# Patient Record
Sex: Female | Born: 1960 | Race: White | Hispanic: No | Marital: Married | Smoking: Never smoker
Health system: Southern US, Community
[De-identification: ages and names within clinical notes are randomized; demographics above are authoritative.]

## PROBLEM LIST (undated history)

## (undated) DIAGNOSIS — T7840XA Allergy, unspecified, initial encounter: Secondary | ICD-10-CM

## (undated) DIAGNOSIS — F419 Anxiety disorder, unspecified: Secondary | ICD-10-CM

## (undated) DIAGNOSIS — F32A Depression, unspecified: Secondary | ICD-10-CM

## (undated) DIAGNOSIS — F319 Bipolar disorder, unspecified: Secondary | ICD-10-CM

## (undated) DIAGNOSIS — K219 Gastro-esophageal reflux disease without esophagitis: Secondary | ICD-10-CM

## (undated) DIAGNOSIS — R296 Repeated falls: Secondary | ICD-10-CM

## (undated) DIAGNOSIS — W19XXXA Unspecified fall, initial encounter: Secondary | ICD-10-CM

## (undated) DIAGNOSIS — M779 Enthesopathy, unspecified: Secondary | ICD-10-CM

## (undated) HISTORY — DX: Repeated falls: R29.6

## (undated) HISTORY — DX: Unspecified fall, initial encounter: W19.XXXA

## (undated) HISTORY — PX: OVARIAN CYST REMOVAL: SHX89

## (undated) HISTORY — DX: Enthesopathy, unspecified: M77.9

## (undated) HISTORY — DX: Gastro-esophageal reflux disease without esophagitis: K21.9

## (undated) HISTORY — DX: Allergy, unspecified, initial encounter: T78.40XA

## (undated) HISTORY — DX: Anxiety disorder, unspecified: F41.9

## (undated) HISTORY — DX: Bipolar disorder, unspecified: F31.9

## (undated) HISTORY — DX: Depression, unspecified: F32.A

---

## 1998-11-01 HISTORY — PX: HERNIA REPAIR: SHX51

## 2021-06-08 ENCOUNTER — Ambulatory Visit: Payer: Self-pay

## 2021-08-31 ENCOUNTER — Encounter: Payer: Self-pay | Admitting: Adult Health

## 2021-08-31 ENCOUNTER — Ambulatory Visit (INDEPENDENT_AMBULATORY_CARE_PROVIDER_SITE_OTHER): Payer: BC Managed Care – PPO | Admitting: Adult Health

## 2021-08-31 ENCOUNTER — Other Ambulatory Visit: Payer: Self-pay

## 2021-08-31 ENCOUNTER — Telehealth: Payer: Self-pay | Admitting: Adult Health

## 2021-08-31 DIAGNOSIS — M779 Enthesopathy, unspecified: Secondary | ICD-10-CM | POA: Insufficient documentation

## 2021-08-31 DIAGNOSIS — F331 Major depressive disorder, recurrent, moderate: Secondary | ICD-10-CM | POA: Diagnosis not present

## 2021-08-31 DIAGNOSIS — F429 Obsessive-compulsive disorder, unspecified: Secondary | ICD-10-CM

## 2021-08-31 DIAGNOSIS — Z79899 Other long term (current) drug therapy: Secondary | ICD-10-CM

## 2021-08-31 DIAGNOSIS — F4310 Post-traumatic stress disorder, unspecified: Secondary | ICD-10-CM | POA: Diagnosis not present

## 2021-08-31 DIAGNOSIS — F319 Bipolar disorder, unspecified: Secondary | ICD-10-CM

## 2021-08-31 DIAGNOSIS — G4700 Insomnia, unspecified: Secondary | ICD-10-CM | POA: Diagnosis not present

## 2021-08-31 DIAGNOSIS — F411 Generalized anxiety disorder: Secondary | ICD-10-CM

## 2021-08-31 MED ORDER — FLUOXETINE HCL 40 MG PO CAPS: 40.0000 mg | ORAL_CAPSULE | Freq: Every day | ORAL | 3 refills | Status: DC

## 2021-08-31 NOTE — Telephone Encounter (Signed)
Pt was seen this morning as new pt. Called asking if labs should be fasting. She was advised no. Also, needed to know if she should continue taking Buspar with new meds? Contact # [phone]

## 2021-08-31 NOTE — Telephone Encounter (Signed)
Please review

## 2021-08-31 NOTE — Progress Notes (Signed)
[facility] MD/PA/NP Initial Note  09/01/2021 10:37 AM Vicki Newman  MRN:  [id number]  Chief Complaint:   HPI:  Previous provider in [place] - will request records.  Describes mood today as "not the best". Pleasant. Tearful at times. Mood symptoms - reports depression, anxiety, and irritability. Noting mood has declined over the last three months. Increased situational stressors. Recently moved from [place] to [place]. Husband found a job in [employer] working day shift and was switched to nights after starting. Mother had a stroke in September and was found to have lung cancer. She has been given 3 to 4 months to live. Will be travelling to see her - receiving hospice care. Stating "it's been a lot". Feels more on edge than anything - feels like she always needs to be doing something. Unable to settle down - mind racing. Has not been sleeping well. Does have some previous records and information on previous medication trials. Varying interest and motivation. Taking medications as prescribed.  Energy levels lower. Active, does not have a regular exercise routine. Walking and stretching some days. Visited daughter recently and did some hiking. Enjoys some usual interests and activities. Married. Lives with husband of 35 years. Has 3 children - daughters - 6, 81, 71. Spending time with family. Appetite decreasing. Weight stable 134.2 pounds. Sleeping difficulties. Averages 4 hours.   Focus and concentration stable. Completing tasks. Managing aspects of household. 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 Worked as an Programmer, systems.  Denies SI or HI.  Denies AH or VH. Vicki Newman - online therapist - DBT. Reports post partum x 3 pregnancies.   Previous medication trials:   Vyvanse - manic Wellbutrin - manic Zoloft - slugglish and no sex drive Nortriptyline - constipation Paxil - doesn't remember Prozac - unknown Abilify - akathesia Pristiq - don't remember Lunesta - don't remember Viibryd - N/V Lexapro - don't remember   Cymbalta - don't remember Effexor - don't remember Buspar - don't remember Luvox - don't remember Zyprexa - don't remember Topamax - don't remember Trileptal - balance issues Saphris - eyes not blinking Seroquel - constipation, brain fog  Visit Diagnosis:    ICD-10-CM   1. Bipolar I disorder (HCC)  F31.9     2. Insomnia, unspecified type  G47.00     3. PTSD (post-traumatic stress disorder)  F43.10 FLUoxetine (PROZAC) 40 MG capsule    4. Major depressive disorder, recurrent episode, moderate (HCC)  F33.1 FLUoxetine (PROZAC) 40 MG capsule    5. Generalized anxiety disorder  F41.1 FLUoxetine (PROZAC) 40 MG capsule    6. Obsessive-compulsive disorder, unspecified type  F42.9 FLUoxetine (PROZAC) 40 MG capsule    7. High risk medication use  Z79.899 Lithium level    TSH    Basic metabolic panel      Past Psychiatric History: Admitted twice for postpartum x 2. One admission for depressive episode. Admitted with manic episode. Last admission 2012  Past Medical History:  Past Medical History:  Diagnosis Date   Bone spur    shoulder   Falls    GERD (gastroesophageal reflux disease)    History reviewed. No pertinent surgical history.  Family Psychiatric History: Family history of mental illness. Father BPD, mother depression, 2 brothers with depression.  Family History: History reviewed. No pertinent family history.  Social History:  Social History   Socioeconomic History   Marital status: Married    Spouse name: Not on file   Number of children: Not on file   Years of education: Not on file  Highest education level: Not on file  Occupational History   Not on file  Tobacco Use   Smoking status: Never   Smokeless tobacco: Never  Substance and Sexual Activity   Alcohol use: Never   Drug use: Not on file   Sexual activity: Not on file  Other Topics Concern   Not on file  Social History Narrative   Not on file   Social Determinants of Health   Financial  Resource Strain: Not on file  Food Insecurity: Not on file  Transportation Needs: Not on file  Physical Activity: Not on file  Stress: Not on file  Social Connections: Not on file    Allergies:  Allergies  Allergen Reactions   Sulfa Antibiotics    Valacyclovir     Metabolic Disorder Labs: No results found for: HGBA1C, MPG No results found for: PROLACTIN No results found for: CHOL, TRIG, HDL, CHOLHDL, VLDL, LDLCALC No results found for: TSH  Therapeutic Level Labs: No results found for: LITHIUM No results found for: VALPROATE No components found for:  CBMZ  Current Medications: Current Outpatient Medications  Medication Sig Dispense Refill   albuterol (VENTOLIN HFA) 108 (90 Base) MCG/ACT inhaler Inhale into the lungs.     busPIRone (BUSPAR) 5 MG tablet Take 5 mg by mouth 2 (two) times daily.     FLUoxetine (PROZAC) 10 MG capsule Take 10 mg by mouth daily.     FLUoxetine (PROZAC) 40 MG capsule Take 1 capsule (40 mg total) by mouth daily. 90 capsule 3   lamoTRIgine (LAMICTAL) 150 MG tablet Take 150 mg by mouth 2 (two) times daily.     lithium carbonate (ESKALITH) 450 MG CR tablet Take 450 mg by mouth daily as needed.     omeprazole (PRILOSEC) 20 MG capsule Take 20 mg by mouth daily.     QVAR REDIHALER 40 MCG/ACT inhaler Inhale into the lungs.     No current facility-administered medications for this visit.    Medication Side Effects: none  Orders placed this visit:   Orders Placed This Encounter  Procedures   Lithium level   TSH   Basic metabolic panel     Psychiatric Specialty Exam:  Review of Systems  Musculoskeletal:  Negative for gait problem.  Neurological:  Negative for tremors.  Psychiatric/Behavioral:         Please refer to HPI   Blood pressure 131/79, pulse 79, height 5\' 5"  (1.651 m), weight 134 lb 3.2 oz (60.9 kg).Body mass index is 22.33 kg/m.  General Appearance: Casual and Neat  Eye Contact:  Good  Speech:  Clear and Coherent and Normal Rate   Volume:  Normal  Mood:  Euthymic  Affect:  Appropriate and Congruent  Thought Process:  Coherent and Descriptions of Associations: Intact  Orientation:  Full (Time, Place, and Person)  Thought Content: Logical   Suicidal Thoughts:  No  Homicidal Thoughts:  No  Memory:  WNL  Judgement:  Good  Insight:  Good  Psychomotor Activity:  Normal  Concentration:  Concentration: Good  Recall:  Good  Fund of Knowledge: Good  Language: Good  Assets:  Communication Skills Desire for Improvement Financial Resources/Insurance Housing Intimacy Leisure Time Physical Health Resilience Social Support Talents/Skills Transportation Vocational/Educational  ADL's:  Intact  Cognition: WNL  Prognosis:  Good   Screenings: MDQ  Receiving Psychotherapy: No   Treatment Plan/Recommendations:  Plan:  PDMP reviewed  Add Lybalvi 5mg  at hs - 4 weeks of samples given - will call with an update  for a prescription if helpful.  TAKING: Prozac 50mg  daily Lamictal 150mg  BID  Lithium 450mg  daily with dinner Buspar 7.5mg  BID  NOT TAKING: Temazepam 7.5mg  at hs Clonazepam 0.5mg  for sleep  Lab slips given for: Lithium level, BMP, and TSH since there are no recent findings available for review.  Patient seen for 30 minutes and time spent discussing treatment options.  RTC 4 weeks  Patient advised to contact office with any questions, adverse effects, or acute worsening in signs and symptoms.  Counseled patient regarding potential benefits, risks, and side effects of Lamictal to include potential risk of Stevens-Johnson syndrome. Advised patient to stop taking Lamictal and contact office immediately if rash develops and to seek urgent medical attention if rash is severe and/or spreading quickly.   Reviewed signs and symptoms of lithium toxicity, including; slurred speech, worsening tremors, nausea, vomiting, diarrhea, confusion and ataxia. Patient instructed to notify clinic if experiencing the  symptoms or go to the ER if the symptoms are severe or occurring outside of office hours. Discussed potential drug interactions. Discussed hydration and risk of toxicity. Discussed avoiding diuretics or Motrin and advised patient to notify office if started on either. Discussed routine blood monitoring for renal and thyroid functioning due to potential long-term effects associated with lithium use.    , NP

## 2021-09-01 ENCOUNTER — Encounter: Payer: Self-pay | Admitting: Adult Health

## 2021-09-01 ENCOUNTER — Telehealth: Payer: Self-pay | Admitting: Adult Health

## 2021-09-01 NOTE — Telephone Encounter (Signed)
Vicki Newman called and said she thinks she is having an allergic reaction to a medication. Her phone number is [phone].

## 2021-09-01 NOTE — Telephone Encounter (Signed)
She can continue the Buspar.

## 2021-09-01 NOTE — Telephone Encounter (Signed)
Patient was called with this information and expressed understanding.

## 2021-09-01 NOTE — Telephone Encounter (Signed)
Patient took her first dose of Lybalvi 5 mg last night. She stated she slept, but sleep was restless. She states this morning she had nausea and vomiting that has now resolved, but that she is dizzy - states it is all the time, not orthostatic. She doesn't know if she had an allergic reaction or is just sensitive to the medication.  Please advise.

## 2021-09-01 NOTE — Telephone Encounter (Signed)
LVM with info

## 2021-09-03 LAB — TSH: TSH: 2.23 u[IU]/mL (ref 0.450–4.500)

## 2021-09-03 LAB — BASIC METABOLIC PANEL
BUN/Creatinine Ratio: 21 (ref 9–23)
BUN: 24 mg/dL (ref 6–24)
CO2: 26 mmol/L (ref 20–29)
Calcium: 10.7 mg/dL — ABNORMAL HIGH (ref 8.7–10.2)
Chloride: 107 mmol/L — ABNORMAL HIGH (ref 96–106)
Creatinine, Ser: 1.17 mg/dL — ABNORMAL HIGH (ref 0.57–1.00)
Glucose: 93 mg/dL (ref 70–99)
Potassium: 4.5 mmol/L (ref 3.5–5.2)
Sodium: 145 mmol/L — ABNORMAL HIGH (ref 134–144)
eGFR: 54 mL/min/{1.73_m2} — ABNORMAL LOW (ref >59)

## 2021-09-03 LAB — LITHIUM LEVEL: Lithium Lvl: 0.7 mmol/L (ref 0.5–1.2)

## 2021-09-04 ENCOUNTER — Other Ambulatory Visit: Payer: Self-pay | Admitting: Adult Health

## 2021-09-04 DIAGNOSIS — G4700 Insomnia, unspecified: Secondary | ICD-10-CM

## 2021-09-04 DIAGNOSIS — F411 Generalized anxiety disorder: Secondary | ICD-10-CM

## 2021-09-04 MED ORDER — BUSPIRONE HCL 10 MG PO TABS: 10.0000 mg | ORAL_TABLET | Freq: Two times a day (BID) | ORAL | 2 refills | Status: DC

## 2021-09-04 MED ORDER — QUETIAPINE FUMARATE 50 MG PO TABS: 50.0000 mg | ORAL_TABLET | Freq: Every day | ORAL | 2 refills | Status: DC

## 2021-09-09 ENCOUNTER — Telehealth: Payer: Self-pay | Admitting: Adult Health

## 2021-09-09 NOTE — Telephone Encounter (Signed)
Pt called and said that the seroquel is helping a little but thinks it needs to be increased to 75 mg. She started the buspar yesterday. Please give her a call at [phone]

## 2021-09-09 NOTE — Telephone Encounter (Signed)
Ok she can take 50mg  - 1 to 2 if needed - ok to send in new script.

## 2021-09-09 NOTE — Telephone Encounter (Signed)
Pt informed

## 2021-09-09 NOTE — Telephone Encounter (Signed)
Pt started Seroquel 50 mg last week and you told her to call this week to update.She stated it's helping her sleep better but not enough.

## 2021-09-14 ENCOUNTER — Ambulatory Visit: Payer: BC Managed Care – PPO | Admitting: Adult Health

## 2021-09-18 ENCOUNTER — Telehealth: Payer: Self-pay | Admitting: Psychiatry

## 2021-09-18 NOTE — Telephone Encounter (Signed)
Vicki Newman is taking care of her terminally ill mom in [place], [place] and is having a lot of anxiety due to the situation. She is requesting a refill on her Buspar. Please call the script in to her local pharmacy at:  [id number] [place], [place] - [address]  Phone:  [phone]  Fax:  [phone]   [facility] where her mother lives and she will pick up.

## 2021-09-18 NOTE — Telephone Encounter (Signed)
She can increase up to 10mg  TID from 7.5 BID.

## 2021-09-18 NOTE — Telephone Encounter (Signed)
See previous note.   Patient has refills available. Called her to let her know and she stated the problem was that the 10 mg dose "is not cutting it" and she is requesting a dose increase or something else to help her get thru the situational anxiety. She said she can cut pills in half if she needed to, but that they will not refill meds too early, so will need a new Rx if dose is increased.  Please advise.

## 2021-09-18 NOTE — Telephone Encounter (Signed)
Patient notified

## 2021-09-28 ENCOUNTER — Other Ambulatory Visit: Payer: Self-pay

## 2021-09-28 ENCOUNTER — Ambulatory Visit: Payer: BC Managed Care – PPO | Admitting: Adult Health

## 2021-09-28 ENCOUNTER — Telehealth: Payer: Self-pay | Admitting: Adult Health

## 2021-09-28 DIAGNOSIS — F411 Generalized anxiety disorder: Secondary | ICD-10-CM

## 2021-09-28 DIAGNOSIS — G4700 Insomnia, unspecified: Secondary | ICD-10-CM

## 2021-09-28 MED ORDER — QUETIAPINE FUMARATE 50 MG PO TABS: 100.0000 mg | ORAL_TABLET | Freq: Every day | ORAL | 0 refills | Status: DC

## 2021-09-28 MED ORDER — LAMOTRIGINE 150 MG PO TABS: 150.0000 mg | ORAL_TABLET | Freq: Two times a day (BID) | ORAL | 0 refills | Status: DC

## 2021-09-28 MED ORDER — BUSPIRONE HCL 10 MG PO TABS: 10.0000 mg | ORAL_TABLET | Freq: Three times a day (TID) | ORAL | 0 refills | Status: DC

## 2021-09-28 NOTE — Telephone Encounter (Signed)
Pt called and said that she needs a refills on her seroquel 100 mg 1 x d. She has increased it and is out. She also needs a new script of buspar 10 mg 3 x d. She also needs a refill on her lamictal 150 mg.[facility] [address] [facility] [place]

## 2021-09-28 NOTE — Telephone Encounter (Signed)
Rx sent 

## 2021-10-12 ENCOUNTER — Other Ambulatory Visit: Payer: Self-pay

## 2021-10-12 ENCOUNTER — Encounter: Payer: Self-pay | Admitting: Adult Health

## 2021-10-12 ENCOUNTER — Ambulatory Visit: Payer: BC Managed Care – PPO | Admitting: Adult Health

## 2021-10-12 DIAGNOSIS — G4700 Insomnia, unspecified: Secondary | ICD-10-CM

## 2021-10-12 DIAGNOSIS — F319 Bipolar disorder, unspecified: Secondary | ICD-10-CM | POA: Diagnosis not present

## 2021-10-12 DIAGNOSIS — F411 Generalized anxiety disorder: Secondary | ICD-10-CM

## 2021-10-12 DIAGNOSIS — F4310 Post-traumatic stress disorder, unspecified: Secondary | ICD-10-CM | POA: Diagnosis not present

## 2021-10-12 DIAGNOSIS — F331 Major depressive disorder, recurrent, moderate: Secondary | ICD-10-CM

## 2021-10-12 MED ORDER — QUETIAPINE FUMARATE 50 MG PO TABS: 100.0000 mg | ORAL_TABLET | Freq: Every day | ORAL | 5 refills | Status: DC

## 2021-10-12 MED ORDER — BUSPIRONE HCL 15 MG PO TABS: 15.0000 mg | ORAL_TABLET | Freq: Three times a day (TID) | ORAL | 5 refills | Status: DC

## 2021-10-12 MED ORDER — LITHIUM CARBONATE ER 450 MG PO TBCR: 450.0000 mg | EXTENDED_RELEASE_TABLET | Freq: Every day | ORAL | 1 refills | Status: DC | PRN

## 2021-10-12 MED ORDER — LAMOTRIGINE 150 MG PO TABS: 150.0000 mg | ORAL_TABLET | Freq: Two times a day (BID) | ORAL | 1 refills | Status: DC

## 2021-10-12 NOTE — Progress Notes (Signed)
Nimo Verastegui [id number] Jun 29, 1961 60 y.o.  Subjective:   Patient ID:  Shatima Zalar is a 60 y.o. (DOB 10-04-1961) female.  Chief Complaint: No chief complaint on file.  Previous provider in [place] - will request records.  HPI Aberdeen Hafen presents to the office today for follow-up of insomnia, PTSD, MDD, GAD, and BPD-1.  Describes mood today as "lower". Pleasant. Tearful at times. Mood symptoms - reports depression, anxiety, and irritability. Feeling distracted. Stating "I'm trying to manage things". Mother recently passed away - 3 weeks ago. Suffered from a stroke earlier in the year followed by lung cancer. Father coming to [place] to visit over the holidays. Brother lost his wife 2 years ago at Christmas and is struggling. Will be reaching out to hospice this week for counseling. Also plans to get involved in group therapy. Worried about husband - he does not like his job and is very unhappy - working night shift. He has offered to let her help him. Varying interest and motivation. Taking medications as prescribed Energy levels lower. Active, has a regular exercise routine.  Enjoys some usual interests and activities. Married. Lives with husband of 35 years. Has 3 children - daughters - 40, 71, 47. Spending time with family. Appetite decreasing. Weight loss over past few weeks - 130 from 134.2 pounds. Sleeping difficulties. Averages 9 hours with Seroquel and Melatonin. Focus and concentration stable. Completing tasks. Managing aspects of household. 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 Worked as an Programmer, systems.  Denies SI or HI.  Denies AH or VH. [name] - online therapist - DBT. Reports post partum x 3 pregnancies.   Previous medication trials:   Vyvanse - manic Wellbutrin - manic Zoloft - slugglish and no sex drive Nortriptyline - constipation Paxil - doesn't remember Prozac - unknown Abilify - akathesia Pristiq - don't remember Lunesta - don't remember Viibryd - N/V Lexapro -  don't remember  Cymbalta - don't remember Effexor - don't remember Buspar - don't remember Luvox - don't remember Zyprexa - don't remember Topamax - don't remember Trileptal - balance issues Saphris - eyes not blinking Seroquel - constipation, brain fog      Review of Systems:  Review of Systems  Musculoskeletal:  Negative for gait problem.  Neurological:  Negative for tremors.  Psychiatric/Behavioral:         Please refer to HPI   Medications: I have reviewed the patient's current medications.  Current Outpatient Medications  Medication Sig Dispense Refill   albuterol (VENTOLIN HFA) 108 (90 Base) MCG/ACT inhaler Inhale into the lungs.     busPIRone (BUSPAR) 15 MG tablet Take 1 tablet (15 mg total) by mouth 3 (three) times daily. 90 tablet 5   FLUoxetine (PROZAC) 10 MG capsule Take 10 mg by mouth daily.     FLUoxetine (PROZAC) 40 MG capsule Take 1 capsule (40 mg total) by mouth daily. 90 capsule 3   lamoTRIgine (LAMICTAL) 150 MG tablet Take 1 tablet (150 mg total) by mouth 2 (two) times daily. 180 tablet 1   lithium carbonate (ESKALITH) 450 MG CR tablet Take 1 tablet (450 mg total) by mouth daily as needed. 90 tablet 1   omeprazole (PRILOSEC) 20 MG capsule Take 20 mg by mouth daily.     QUEtiapine (SEROQUEL) 50 MG tablet Take 2 tablets (100 mg total) by mouth at bedtime. 30 tablet 5   QVAR REDIHALER 40 MCG/ACT inhaler Inhale into the lungs.     No current facility-administered medications for this visit.    Medication Side  Effects: None  Allergies:  Allergies  Allergen Reactions   Sulfa Antibiotics Rash   Valacyclovir     Other reaction(s): Dizziness (intolerance)    Past Medical History:  Diagnosis Date   Bone spur    shoulder   Falls    GERD (gastroesophageal reflux disease)     Past Medical History, Surgical history, Social history, and Family history were reviewed and updated as appropriate.   Please see review of systems for further details on the  patient's review from today.   Objective:   Physical Exam:  There were no vitals taken for this visit.  Physical Exam Constitutional:      General: She is not in acute distress. Musculoskeletal:        General: No deformity.  Neurological:     Mental Status: She is alert and oriented to person, place, and time.     Coordination: Coordination normal.  Psychiatric:        Attention and Perception: Attention and perception normal. She does not perceive auditory or visual hallucinations.        Mood and Affect: Mood normal. Mood is not anxious or depressed. Affect is not labile, blunt, angry or inappropriate.        Speech: Speech normal.        Behavior: Behavior normal.        Thought Content: Thought content normal. Thought content is not paranoid or delusional. Thought content does not include homicidal or suicidal ideation. Thought content does not include homicidal or suicidal plan.        Cognition and Memory: Cognition and memory normal.        Judgment: Judgment normal.     Comments: Insight intact    Lab Review:     Component Value Date/Time   NA 145 (H) 09/02/2021 0744   K 4.5 09/02/2021 0744   CL 107 (H) 09/02/2021 0744   CO2 26 09/02/2021 0744   GLUCOSE 93 09/02/2021 0744   BUN 24 09/02/2021 0744   CREATININE 1.17 (H) 09/02/2021 0744   CALCIUM 10.7 (H) 09/02/2021 0744    No results found for: WBC, RBC, HGB, HCT, PLT, MCV, MCH, MCHC, RDW, LYMPHSABS, MONOABS, EOSABS, BASOSABS  Lithium Lvl  Date Value Ref Range Status  09/02/2021 0.7 0.5 - 1.2 mmol/L Final    Comment:    A concentration of 0.5-0.8 mmol/L is advised for long-term use; concentrations of up to 1.2 mmol/L may be necessary during acute treatment.                                  Detection Limit = 0.1                           <0.1 indicates None Detected      No results found for: PHENYTOIN, PHENOBARB, VALPROATE, CBMZ   .res Assessment: Plan:    Plan:  PDMP reviewed  TAKING: Prozac 50mg   daily Lamictal 150mg  BID  Lithium 450mg  daily with dinner Buspar 10mg  BID Seroquel 50mg  at hs  NOT TAKING: Temazepam 7.5mg  at hs Clonazepam 0.5mg  for sleep  09/02/2021 Lithium level - 0.7  BMP - CR 1.17 TSH - 2.230  Time spent with patient was minutes. Greater than 50% of face to face time with patient was spent on counseling and coordination of care.   RTC 4 weeks  Patient advised to contact office  with any questions, adverse effects, or acute worsening in signs and symptoms.  Counseled patient regarding potential benefits, risks, and side effects of Lamictal to include potential risk of Stevens-Johnson syndrome. Advised patient to stop taking Lamictal and contact office immediately if rash develops and to seek urgent medical attention if rash is severe and/or spreading quickly.   Reviewed signs and symptoms of lithium toxicity, including; slurred speech, worsening tremors, nausea, vomiting, diarrhea, confusion and ataxia. Patient instructed to notify clinic if experiencing the symptoms or go to the ER if the symptoms are severe or occurring outside of office hours. Discussed potential drug interactions. Discussed hydration and risk of toxicity. Discussed avoiding diuretics or Motrin and advised patient to notify office if started on either. Discussed routine blood monitoring for renal and thyroid functioning due to potential long-term effects associated with lithium use.    Diagnoses and all orders for this visit:  Insomnia, unspecified type -     QUEtiapine (SEROQUEL) 50 MG tablet; Take 2 tablets (100 mg total) by mouth at bedtime.  Generalized anxiety disorder -     busPIRone (BUSPAR) 15 MG tablet; Take 1 tablet (15 mg total) by mouth 3 (three) times daily.  PTSD (post-traumatic stress disorder)  Bipolar I disorder (HCC) -     busPIRone (BUSPAR) 15 MG tablet; Take 1 tablet (15 mg total) by mouth 3 (three) times daily. -     lamoTRIgine (LAMICTAL) 150 MG tablet; Take 1 tablet  (150 mg total) by mouth 2 (two) times daily. -     lithium carbonate (ESKALITH) 450 MG CR tablet; Take 1 tablet (450 mg total) by mouth daily as needed.  Major depressive disorder, recurrent episode, moderate (HCC)    Please see After Visit Summary for patient specific instructions.  Future Appointments  Date Time Provider Department Center  11/23/2021 12:00 PM Kieara Schwark, Thereasa Solo, NP [facility] None     No orders of the defined types were placed in this encounter.   -------------------------------

## 2021-11-17 ENCOUNTER — Encounter: Payer: Self-pay | Admitting: Adult Health

## 2021-11-17 ENCOUNTER — Other Ambulatory Visit: Payer: Self-pay

## 2021-11-17 ENCOUNTER — Ambulatory Visit (INDEPENDENT_AMBULATORY_CARE_PROVIDER_SITE_OTHER): Payer: BC Managed Care – PPO | Admitting: Adult Health

## 2021-11-17 DIAGNOSIS — F4310 Post-traumatic stress disorder, unspecified: Secondary | ICD-10-CM | POA: Diagnosis not present

## 2021-11-17 DIAGNOSIS — F319 Bipolar disorder, unspecified: Secondary | ICD-10-CM | POA: Diagnosis not present

## 2021-11-17 DIAGNOSIS — G4700 Insomnia, unspecified: Secondary | ICD-10-CM

## 2021-11-17 DIAGNOSIS — F429 Obsessive-compulsive disorder, unspecified: Secondary | ICD-10-CM

## 2021-11-17 DIAGNOSIS — F331 Major depressive disorder, recurrent, moderate: Secondary | ICD-10-CM

## 2021-11-17 DIAGNOSIS — F411 Generalized anxiety disorder: Secondary | ICD-10-CM | POA: Diagnosis not present

## 2021-11-17 MED ORDER — TEMAZEPAM 15 MG PO CAPS: 15.0000 mg | ORAL_CAPSULE | Freq: Every evening | ORAL | 2 refills | Status: DC | PRN

## 2021-11-17 NOTE — Progress Notes (Signed)
Vicki HalimJacqueline Kochan [id number] 05-Mar-1961 61 y.o.  Subjective:   Patient ID:  Vicki Newman is a 61 y.o. (DOB 05-Mar-1961) female.  Chief Complaint: No chief complaint on file.   HPI Vicki Newman presents to the office today for follow-up of insomnia, PTSD, MDD, GAD, and BPD-1.  Describes mood today as "not too good". Pleasant. Tearful at times. Mood symptoms - reports depression, anxiety, and irritability. Increased worry and rumination. Stating "a mole hill becomes a mountain". Worried about where she and husband are going to live when their lease is up. Brother visiting this week. Grieving loss of mother. Feeling distracted. Stating "I'm trying to manage things". Journaling. Meditation. Doing light therapy. Working with therapist and involved in hospice counseling. Worried about husband -"not as much as I was". Decreased interest and motivation. Taking medications as prescribed Energy levels lower - "I'm always tired". Active, not as much. Enjoys some usual interests and activities. Married. Lives with husband of 35 years. Has 3 children - daughters - [id number], 8424, 7022. Spending time with family. Appetite decreasing. Weight loss over past few weeks - 129 from 134.2 pounds. Sleeping difficulties. Averages 8 hours of broken sleep - stopped Seroquel due to RLS. Now taking Clonazepam - 1/4 of a tablet and 4mg  Melatonin. Focus and concentration difficulties. Completing tasks. Managing some aspects of household. 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 Worked as an Programmer, systemseducator.  Denies SI or HI.  Denies AH or VH. Vicki Newman - online therapist - DBT.  Reports post partum x 3 pregnancies.   Previous medication trials:   Vyvanse - manic Wellbutrin - manic Zoloft - slugglish and no sex drive Nortriptyline - constipation Paxil - doesn't remember Prozac - unknown Abilify - akathesia Pristiq - don't remember Lunesta - don't remember Viibryd - N/V Lexapro - don't remember  Cymbalta - don't remember Effexor  - don't remember Buspar - don't remember Luvox - don't remember Zyprexa - don't remember Topamax - don't remember Trileptal - balance issues Saphris - eyes not blinking Seroquel - constipation, brain fog      Review of Systems:  Review of Systems  Musculoskeletal:  Negative for gait problem.  Neurological:  Negative for tremors.  Psychiatric/Behavioral:         Please refer to HPI   Medications: I have reviewed the patient's current medications.  Current Outpatient Medications  Medication Sig Dispense Refill   temazepam (RESTORIL) 15 MG capsule Take 1 capsule (15 mg total) by mouth at bedtime as needed for sleep. 60 capsule 2   albuterol (VENTOLIN HFA) 108 (90 Base) MCG/ACT inhaler Inhale into the lungs.     busPIRone (BUSPAR) 15 MG tablet Take 1 tablet (15 mg total) by mouth 3 (three) times daily. 90 tablet 5   FLUoxetine (PROZAC) 10 MG capsule Take 10 mg by mouth daily.     FLUoxetine (PROZAC) 40 MG capsule Take 1 capsule (40 mg total) by mouth daily. 90 capsule 3   lamoTRIgine (LAMICTAL) 150 MG tablet Take 1 tablet (150 mg total) by mouth 2 (two) times daily. 180 tablet 1   lithium carbonate (ESKALITH) 450 MG CR tablet Take 1 tablet (450 mg total) by mouth daily as needed. 90 tablet 1   omeprazole (PRILOSEC) 20 MG capsule Take 20 mg by mouth daily.     QUEtiapine (SEROQUEL) 50 MG tablet Take 2 tablets (100 mg total) by mouth at bedtime. 30 tablet 5   QVAR REDIHALER 40 MCG/ACT inhaler Inhale into the lungs.     No current facility-administered medications  for this visit.    Medication Side Effects: None  Allergies:  Allergies  Allergen Reactions   Sulfa Antibiotics Rash   Valacyclovir     Other reaction(s): Dizziness (intolerance)    Past Medical History:  Diagnosis Date   Bone spur    shoulder   Falls    GERD (gastroesophageal reflux disease)     Past Medical History, Surgical history, Social history, and Family history were reviewed and updated as  appropriate.   Please see review of systems for further details on the patient's review from today.   Objective:   Physical Exam:  There were no vitals taken for this visit.  Physical Exam Constitutional:      General: She is not in acute distress. Musculoskeletal:        General: No deformity.  Neurological:     Mental Status: She is alert and oriented to person, place, and time.     Coordination: Coordination normal.  Psychiatric:        Attention and Perception: Attention and perception normal. She does not perceive auditory or visual hallucinations.        Mood and Affect: Mood normal. Mood is not anxious or depressed. Affect is not labile, blunt, angry or inappropriate.        Speech: Speech normal.        Behavior: Behavior normal.        Thought Content: Thought content normal. Thought content is not paranoid or delusional. Thought content does not include homicidal or suicidal ideation. Thought content does not include homicidal or suicidal plan.        Cognition and Memory: Cognition and memory normal.        Judgment: Judgment normal.     Comments: Insight intact    Lab Review:     Component Value Date/Time   NA 145 (H) 09/02/2021 0744   K 4.5 09/02/2021 0744   CL 107 (H) 09/02/2021 0744   CO2 26 09/02/2021 0744   GLUCOSE 93 09/02/2021 0744   BUN 24 09/02/2021 0744   CREATININE 1.17 (H) 09/02/2021 0744   CALCIUM 10.7 (H) 09/02/2021 0744    No results found for: WBC, RBC, HGB, HCT, PLT, MCV, MCH, MCHC, RDW, LYMPHSABS, MONOABS, EOSABS, BASOSABS  Lithium Lvl  Date Value Ref Range Status  09/02/2021 0.7 0.5 - 1.2 mmol/L Final    Comment:    A concentration of 0.5-0.8 mmol/L is advised for long-term use; concentrations of up to 1.2 mmol/L may be necessary during acute treatment.                                  Detection Limit = 0.1                           <0.1 indicates None Detected      No results found for: PHENYTOIN, PHENOBARB, VALPROATE, CBMZ    .res Assessment: Plan:     Plan:  PDMP reviewed  TAKING: Prozac 50mg  daily Lamictal 150mg  BID  Lithium 450mg  daily with dinner Buspar 15 mg TID D/C Seroquel 50mg  at hs ADD Temazepam 7.5mg  at hs  Clonazepam 0.5mg  for sleep prn - 1/4 of a tablet  09/02/2021 Lithium level - 0.7  BMP - CR 1.17 TSH - 2.230  Seeing therapist and grief support.  Time spent with patient was minutes. Greater than 50% of face to face  time with patient was spent on counseling and coordination of care.   RTC 4 weeks  Patient advised to contact office with any questions, adverse effects, or acute worsening in signs and symptoms.  Counseled patient regarding potential benefits, risks, and side effects of Lamictal to include potential risk of Stevens-Johnson syndrome. Advised patient to stop taking Lamictal and contact office immediately if rash develops and to seek urgent medical attention if rash is severe and/or spreading quickly.   Reviewed signs and symptoms of lithium toxicity, including; slurred speech, worsening tremors, nausea, vomiting, diarrhea, confusion and ataxia. Patient instructed to notify clinic if experiencing the symptoms or go to the ER if the symptoms are severe or occurring outside of office hours. Discussed potential drug interactions. Discussed hydration and risk of toxicity. Discussed avoiding diuretics or Motrin and advised patient to notify office if started on either. Discussed routine blood monitoring for renal and thyroid functioning due to potential long-term effects associated with lithium use.   Diagnoses and all orders for this visit:  Insomnia, unspecified type -     temazepam (RESTORIL) 15 MG capsule; Take 1 capsule (15 mg total) by mouth at bedtime as needed for sleep.  Generalized anxiety disorder  PTSD (post-traumatic stress disorder)  Bipolar I disorder (HCC)  Major depressive disorder, recurrent episode, moderate (HCC)  Obsessive-compulsive disorder,  unspecified type     Please see After Visit Summary for patient specific instructions.  Future Appointments  Date Time Provider Department Center  11/18/2021  2:00 PM Dulce Sellar, NP [facility]    No orders of the defined types were placed in this encounter.   -------------------------------

## 2021-11-18 ENCOUNTER — Ambulatory Visit (INDEPENDENT_AMBULATORY_CARE_PROVIDER_SITE_OTHER): Payer: BC Managed Care – PPO | Admitting: Family

## 2021-11-18 ENCOUNTER — Encounter: Payer: Self-pay | Admitting: Family

## 2021-11-18 VITALS — BP 99/66 | HR 67 | Temp 98.9°F | Ht 65.0 in | Wt 135.0 lb

## 2021-11-18 DIAGNOSIS — K7689 Other specified diseases of liver: Secondary | ICD-10-CM | POA: Insufficient documentation

## 2021-11-18 DIAGNOSIS — N393 Stress incontinence (female) (male): Secondary | ICD-10-CM

## 2021-11-18 DIAGNOSIS — K219 Gastro-esophageal reflux disease without esophagitis: Secondary | ICD-10-CM | POA: Diagnosis not present

## 2021-11-18 DIAGNOSIS — J3081 Allergic rhinitis due to animal (cat) (dog) hair and dander: Secondary | ICD-10-CM

## 2021-11-18 DIAGNOSIS — Z23 Encounter for immunization: Secondary | ICD-10-CM

## 2021-11-18 MED ORDER — OMEPRAZOLE 20 MG PO CPDR: 20.0000 mg | DELAYED_RELEASE_CAPSULE | Freq: Every day | ORAL | 1 refills | Status: DC

## 2021-11-18 MED ORDER — LEVOCETIRIZINE DIHYDROCHLORIDE 5 MG PO TABS: 5.0000 mg | ORAL_TABLET | Freq: Every evening | ORAL | 1 refills | Status: DC

## 2021-11-18 NOTE — Assessment & Plan Note (Signed)
Chronic, stable with Xyzal and Nasacort.

## 2021-11-18 NOTE — Assessment & Plan Note (Addendum)
Chronic - last MRI done in [place] almost a year ago, told she should have re-evaluated in a year.

## 2021-11-18 NOTE — Patient Instructions (Addendum)
Welcome to [facility]! It was a pleasure meeting you today.  As discussed, I have sent your refills to your pharmacy. Please schedule a 6 month follow up visit today.  I have also sent two referrals to Gastroenterology and Physical therapy. They will call you directly to schedule an appointment.      PLEASE NOTE:  If you had any LAB tests please let us know if you have not heard back within a few days. You may see your results on MyChart before we have a chance to review them but we will give you a call once they are reviewed by [place]. If we ordered any REFERRALS today, please let us know if you have not heard from their office within the next week.  Let us know through MyChart if you are needing REFILLS, or have your pharmacy send [place] the request. You can also use MyChart to communicate with me or any office staff.  Please try these tips to maintain a healthy lifestyle:  Eat most of your calories during the day when you are active. Eliminate processed foods including packaged sweets (pies, cakes, cookies), reduce intake of potatoes, white bread, white pasta, and white rice. Look for whole grain options, oat flour or almond flour.  Each meal should contain half fruits/vegetables, one quarter protein, and one quarter carbs (no bigger than a computer mouse).  Cut down on sweet beverages. This includes juice, soda, and sweet tea. Also watch fruit intake, though this is a healthier sweet option, it still contains natural sugar! Limit to 3 servings daily.  Drink at least 1 glass of water with each meal and aim for at least 8 glasses per day  Exercise at least 150 minutes every week.

## 2021-11-18 NOTE — Assessment & Plan Note (Signed)
Chronic - pt received PT in past for pelvic floor exercises, but also received internal therapy and she would like to resume this tx.

## 2021-11-18 NOTE — Progress Notes (Signed)
New Patient Office Visit  Subjective:  Patient ID: Vicki Newman, female    DOB: [birth year]  Age: 61 y.o. MRN: [id number]  CC:  Chief Complaint  Patient presents with   Establish Care    New Patient; Pt is requesting MRI to follow up on Hepatic Cysts.    Annual Exam   Depression    HPI Vicki Newman presents for establishing care and needing refills. GERD: Paitent complains of heartburn. This has been associated with midespigastric pain.   Symptoms have been present for  years. She denies dysphagia.  She has not lost weight. She denies melena, hematochezia, hematemesis, and coffee ground emesis. Medical therapy in the past has included proton pump inhibitors.  Allergic Rhinitis: Vicki Newman is here for follow up of  allergic rhinitis. These symptoms are perennial with seasonal exacerbation. Current triggers include exposure to  pet dander . The patient has been suffering from these symptoms for approximately  years. The patient has tried prescription antihistamines and prescription nasal sprays with good relief of symptoms. Immunotherapy has never been tried. The patient has never had nasal polyps. The patient has no history of asthma. The patient does not suffer from frequent sinopulmonary infections.  Hepatic Cysts:  pt brought last MRI scan with her to visit, done last year, showing multiple cysts that need to be monitored. She is requesting a GI referral. Urinary Incontinence: Patient complains of urinary incontinence. This has been present for  months. She leaks urine with coughing, sneezing, exercise. Patient describes the symptoms as  urine leakage with coughing/heavy physical activity and urine leaking unpredictably.  Factors associated with symptoms include associated with menopause, medications. Treatment to date includes Kegel exercises, which was effective.    Past Medical History:  Diagnosis Date   Allergy    Anxiety    Bipolar 1 disorder (HCC)    with mania    Bone spur    shoulder   Depression    Falls     Past Surgical History:  Procedure Laterality Date   HERNIA REPAIR  2000    Family History  Problem Relation Age of Onset   Hypertension Mother    Hearing loss Mother    Depression Mother    Cancer Mother    Arthritis Mother    Alcohol abuse Mother    Mental illness Mother    Mental illness Father    Hearing loss Father    Depression Father    Arthritis Father    Alcohol abuse Father    Hyperlipidemia Brother    Depression Brother    Alcohol abuse Brother    Mental illness Brother    Depression Brother    Cancer Maternal Grandmother    COPD Paternal Grandfather     Social History   Socioeconomic History   Marital status: Married    Spouse name: Not on file   Number of children: Not on file   Years of education: Not on file   Highest education level: Not on file  Occupational History   Not on file  Tobacco Use   Smoking status: Never   Smokeless tobacco: Never  Substance and Sexual Activity   Alcohol use: Never   Drug use: Never   Sexual activity: Yes  Other Topics Concern   Not on file  Social History Narrative   Not on file   Social Determinants of Health   Financial Resource Strain: Not on file  Food Insecurity: Not on file  Transportation Needs: Not on  file  Physical Activity: Not on file  Stress: Not on file  Social Connections: Not on file  Intimate Partner Violence: Not on file    Objective:   Today's Vitals: BP 99/66    Pulse 67    Temp 98.9 F (37.2 C) (Temporal)    Ht 5\' 5"  (1.651 m)    Wt 135 lb (61.2 kg)    SpO2 98%    BMI 22.47 kg/m   Physical Exam Vitals and nursing note reviewed.  Constitutional:      Appearance: Normal appearance.  Cardiovascular:     Rate and Rhythm: Normal rate and regular rhythm.  Pulmonary:     Effort: Pulmonary effort is normal.     Breath sounds: Normal breath sounds.  Musculoskeletal:        General: Normal range of motion.     Cervical back:  Deformity (Kyphosis) present.  Skin:    General: Skin is warm and dry.  Neurological:     Mental Status: She is alert.  Psychiatric:        Mood and Affect: Mood normal.        Behavior: Behavior normal.    Assessment & Plan:   Problem List Items Addressed This Visit       Respiratory   Allergic rhinitis due to animal hair and dander    Chronic, stable with Xyzal and Nasacort.      Relevant Medications   levocetirizine (XYZAL) 5 MG tablet     Digestive   Hepatic cyst - Primary    Chronic - last MRI done in almost a year ago, told she should have re-evaluated in a year.      Relevant Orders   Ambulatory referral to Gastroenterology   Gastroesophageal reflux disease without esophagitis    Chronic - stable with Omeprazole qd      Relevant Medications   omeprazole (PRILOSEC) 20 MG capsule     Other   Stress incontinence    Chronic - pt received PT in past for pelvic floor exercises, but also received internal therapy and she would like to resume this tx.      Relevant Orders   Ambulatory referral to Physical Therapy   Other Visit Diagnoses     Need for immunization against influenza       Relevant Orders   Flu Vaccine QUAD 58mo+IM (Fluarix, Fluzone & Alfiuria Quad PF) (Completed)       Outpatient Encounter Medications as of 11/18/2021  Medication Sig   B Complex-C (B-COMPLEX WITH VITAMIN C) tablet Take by mouth.   busPIRone (BUSPAR) 15 MG tablet Take 1 tablet (15 mg total) by mouth 3 (three) times daily.   Cholecalciferol 25 MCG (1000 UT) tablet Take by mouth.   clonazePAM (KLONOPIN) 0.5 MG tablet Take 0.5 mg by mouth daily as needed.   FLUoxetine (PROZAC) 10 MG capsule Take 10 mg by mouth daily.   FLUoxetine (PROZAC) 40 MG capsule Take 1 capsule (40 mg total) by mouth daily.   lamoTRIgine (LAMICTAL) 150 MG tablet Take 1 tablet (150 mg total) by mouth 2 (two) times daily.   levocetirizine (XYZAL) 5 MG tablet Take 1 tablet (5 mg total) by mouth every  evening.   lithium carbonate (ESKALITH) 450 MG CR tablet Take 1 tablet (450 mg total) by mouth daily as needed.   Omega-3 1000 MG CAPS Take by mouth.   QVAR REDIHALER 40 MCG/ACT inhaler Inhale into the lungs.   temazepam (RESTORIL) 15 MG capsule Take  1 capsule (15 mg total) by mouth at bedtime as needed for sleep.   [DISCONTINUED] omeprazole (PRILOSEC) 20 MG capsule Take 20 mg by mouth daily.   omeprazole (PRILOSEC) 20 MG capsule Take 1 capsule (20 mg total) by mouth daily.   [DISCONTINUED] albuterol (VENTOLIN HFA) 108 (90 Base) MCG/ACT inhaler Inhale into the lungs. (Patient not taking: Reported on 11/18/2021)   [DISCONTINUED] QUEtiapine (SEROQUEL) 50 MG tablet Take 2 tablets (100 mg total) by mouth at bedtime. (Patient not taking: Reported on 11/18/2021)   No facility-administered encounter medications on file as of 11/18/2021.    Follow-up: Return in about 6 months (around 05/18/2022) for refills.   Dulce Sellar, NP

## 2021-11-18 NOTE — Assessment & Plan Note (Signed)
Chronic - stable with Omeprazole qd

## 2021-11-23 ENCOUNTER — Ambulatory Visit: Payer: BC Managed Care – PPO | Admitting: Adult Health

## 2021-11-24 ENCOUNTER — Other Ambulatory Visit: Payer: Self-pay

## 2021-11-24 ENCOUNTER — Telehealth: Payer: Self-pay | Admitting: Adult Health

## 2021-11-24 MED ORDER — TEMAZEPAM 7.5 MG PO CAPS: 7.5000 mg | ORAL_CAPSULE | Freq: Every evening | ORAL | 0 refills | Status: DC | PRN

## 2021-11-24 NOTE — Telephone Encounter (Signed)
Pt LVM stating Gina prescribed Temazepam for her.  It is making her too sleepy.  She  wants to know if she can reduce the dosage.   Next appt 2/15

## 2021-11-24 NOTE — Telephone Encounter (Signed)
She can try the 7.5mg  dose.

## 2021-11-24 NOTE — Telephone Encounter (Signed)
Pt stated she has been taking the temzepam for a week at 9:30 om,and is sleeping 12 hours.She wants to try the lower dose since unable to break capsule in half

## 2021-11-24 NOTE — Telephone Encounter (Signed)
Pended.

## 2021-11-30 ENCOUNTER — Encounter: Payer: Self-pay | Admitting: Physical Therapy

## 2021-11-30 ENCOUNTER — Ambulatory Visit: Payer: BC Managed Care – PPO | Attending: Family | Admitting: Physical Therapy

## 2021-11-30 ENCOUNTER — Other Ambulatory Visit: Payer: Self-pay

## 2021-11-30 DIAGNOSIS — R269 Unspecified abnormalities of gait and mobility: Secondary | ICD-10-CM | POA: Diagnosis not present

## 2021-11-30 DIAGNOSIS — R293 Abnormal posture: Secondary | ICD-10-CM | POA: Diagnosis not present

## 2021-11-30 DIAGNOSIS — R252 Cramp and spasm: Secondary | ICD-10-CM | POA: Insufficient documentation

## 2021-11-30 DIAGNOSIS — R279 Unspecified lack of coordination: Secondary | ICD-10-CM | POA: Diagnosis not present

## 2021-11-30 DIAGNOSIS — N393 Stress incontinence (female) (male): Secondary | ICD-10-CM | POA: Diagnosis present

## 2021-11-30 DIAGNOSIS — N3946 Mixed incontinence: Secondary | ICD-10-CM

## 2021-11-30 DIAGNOSIS — M6281 Muscle weakness (generalized): Secondary | ICD-10-CM

## 2021-11-30 NOTE — Therapy (Addendum)
[facility] [place] [facility] @ [place], [place], [address] Phone: [phone]   Fax:  [phone]  Physical Therapy Treatment  Patient Details  Name: Vicki Newman MRN: [id number] Date of Birth: [birth year] Referring Provider (PT): Jeanie Sewer, NP   Encounter Date: 11/30/2021   PT End of Session - 11/30/21 0930     Visit Number 1    Date for PT Re-Evaluation 02/28/22    Authorization Type BC[employer]    PT Start Time [phone]   pt arrival time   PT Stop Time 0928    PT Time Calculation (min) 31 min    Activity Tolerance Patient tolerated treatment well    Behavior During Therapy [facility] for tasks assessed/performed             Past Medical History:  Diagnosis Date   Allergy    Anxiety    Bipolar 1 disorder ([place])    with mania   Bone spur    shoulder   Depression    Falls     Past Surgical History:  Procedure Laterality Date   HERNIA REPAIR  2000    There were no vitals filed for this visit.   Subjective Assessment - 11/30/21 0902     Subjective Pt reports pain with intercourse and vaginal penetration starting at least 1.5years ago and at that time unable to tolerate GYN exam as well. Pt also reports stress incontinence starting about this same time with laughing/sneezing/coughing and with urge and unable to make it quickly enough.    Pertinent History 3 vaginal births with tearing with all three    How long can you sit comfortably? no limits    How long can you stand comfortably? no limits    How long can you walk comfortably? no limits    Patient Stated Goals to have less pain and leakage    Currently in Pain? No/denies                [facility] PT Assessment - 11/30/21 0001       Assessment   Medical Diagnosis N39.3 (ICD-10-CM) - Stress incontinence    Referring Provider (PT) Jeanie Sewer, NP    Onset Date/Surgical Date --   1.5 years ago   Prior Therapy yes PFPT in 2021      Precautions    Precautions None      Restrictions   Weight Bearing Restrictions No      Balance Screen   Has the patient fallen in the past 6 months No    Has the patient had a decrease in activity level because of a fear of falling?  No    Is the patient reluctant to leave their home because of a fear of falling?  No      Home Ecologist residence    Living Arrangements Spouse/significant other      Prior Function   Level of [place] Retired    Leisure gaardening, reading, Scientist, product/process development   Overall Cognitive Status Within Functional Limits for tasks assessed      Sensation   Light Touch Appears Intact      Coordination   Gross Motor Movements are Fluid and Coordinated Yes    Fine Motor Movements are Fluid and Coordinated Yes      Posture/Postural Control   Posture/Postural Control Postural limitations    Postural Limitations Rounded Shoulders;Increased lumbar  lordosis;Increased thoracic kyphosis;Posterior pelvic tilt;Right pelvic obliquity      ROM / Strength   AROM / PROM / Strength AROM;Strength      AROM   Overall AROM Comments thoracic spine limited in all directions due to scolosis curving to Rt with Rt rib hump. bil side bending, rotation, flexion,and extension all limited by 75%      Strength   Overall Strength Comments Lt hip 3-/5 in all directions except flexion which was 4/5; Rt hip grossly 4/5 throughout      Flexibility   Soft Tissue Assessment /Muscle Length yes   bil hamstrings and adductors limited by 50%                       Pelvic Floor Special Questions - 11/30/21 0001     Prior Pelvic/Prostate Exam Yes   1.5 years ago with great deal of pain- normal findings   Are you Pregnant or attempting pregnancy? No    Prior Pregnancies Yes    Number of Pregnancies 4    Number of Vaginal Deliveries 3   live births   Any difficulty with labor and deliveries --   tearing with all 3    Currently Sexually Active No    History of sexually transmitted disease No    Marinoff Scale pain prevents any attempts at intercourse    Urinary Leakage Yes    How often very often    Pad use panty liner 1/day    Activities that cause leaking With strong urge;Coughing;Sneezing;Laughing    Urinary urgency Yes    Urinary frequency every 4 hours during the day; usually not but if she wakes up she will go but isnt woken to urinate    Fecal incontinence No    Fluid intake 8 glassess of water per day    Caffeine beverages no    Falling out feeling (prolapse) No    External Perineal Exam deferred due to pt arriving late for eval, agreeable for internal next session as she didn't want to rush it today.                        PT Education - 11/30/21 0930     Education Details Pt educated on exam findings, POC, HEP    Person(s) Educated Patient    Methods Explanation;Demonstration;Tactile cues;Verbal cues;Handout    Comprehension Returned demonstration;Verbalized understanding              PT Short Term Goals - 11/30/21 0939       PT SHORT TERM GOAL #1   Title pt to be I with HEP    Time 4    Period Weeks    Status New    Target Date 12/28/21      PT SHORT TERM GOAL #2   Title pt to report no more than x2 urinary leakage episodes per day for improved symptoms quantity and improved QOL.    Time 4    Period Weeks    Status New    Target Date 12/28/21      PT SHORT TERM GOAL #3   Title pt to demonstrate at least 3/5 pelvic floor strength for improved ability to withhold urine without leaks.    Time 4    Period Weeks    Status New    Target Date 12/28/21      PT SHORT TERM GOAL #4   Title pt to demonstrate ability to  fully relax/contract/bulge/relax with minimal cues for improved pelvic floor moblity and decreased pain at least 50% of the time.    Time 4    Period Weeks    Status New    Target Date 12/28/21               PT Long Term Goals -  11/30/21 0941       PT LONG TERM GOAL #1   Title pt to be I with advanced HEP    Time 3    Period Months    Status New    Target Date 02/28/22      PT LONG TERM GOAL #2   Title pt to report no more than x1 urinary leakage episodes per month for improved symptoms quantity and improved QOL    Time 3    Period Months    Status New    Target Date 02/28/22      PT LONG TERM GOAL #3   Title pt to demonstrate at least 4/5 pelvic floor strength for improved ability to withhold urine without leaks.    Time 3    Period Months    Status New    Target Date 02/28/22      PT LONG TERM GOAL #4   Title pt to demonstrate ability to fully relax/contract/bulge/relax with minimal cues for improved pelvic floor moblity and decreased pain at least 75% of the time.    Time 3    Period Months    Status New    Target Date 02/28/22                   Plan - 11/30/21 0931     Clinical Impression Statement Pt is 61yo female presenting to clinic with referral for stress incontinence, previously had Pelvic floor PT for this and pelvic pain with vaginal penetration but had to stop to work on PT for additional needs outside of pelvic floor. Pt presents today with same problems, leaking urine with sneezing,laughing,coughing and with strong urge and inability to make to bathroom quickly enough, and almost always has post void dribble with urination. Pt also continues to have pain with vaginal penetration limiting ability to have intercourse, complete GYN exam. Both have been going on for at least 1.5 years. Pt reports she has not even attempted intercourse recently due to pain. Pt reports with previous pelvic floor PT she was told she has a tight pelvic floor and had pain during that assessment, is open and consenting to have internal vaginal pelvic floor assessment however due to unfortunately arriving late to appointment, pt requests to hold on internal assessment until next session as to not rush  anything. Pt found to have decreased mobility in thoracic spine in all directions, Rt curve in spine, Rt rib hump, Lt hip hike, no TTP however does report sometimes she has pain in bil SIJ, decreased strength in Bil hips but Lt>Rt and mild fasical restrictions in upper quadrants of abdomen. Pt denied pain thorughout session. Pt updated on HEP, given handouts and motivated to participate in PT. Pt would benefit from additional PT to further address deficits.    Personal Factors and Comorbidities Age;Time since onset of injury/illness/exacerbation;Comorbidity 1    Comorbidities x3 vaginal births with tearing    Examination-Activity Limitations Continence;Hygiene/Grooming;Toileting    Examination-Participation Restrictions Community Activity;Interpersonal Relationship    Stability/Clinical Decision Making Evolving/Moderate complexity    Clinical Decision Making Moderate    Rehab Potential Good    PT  Frequency 1x / week    PT Duration Other (comment)   10 weeks   PT Treatment/Interventions ADLs/Self Care Home Management;Aquatic Therapy;Therapeutic activities;Therapeutic exercise;Functional mobility training;Balance training;Neuromuscular re-education;Manual techniques;Patient/family education;Taping;Scar mobilization;Passive range of motion;Energy conservation    PT Next Visit Plan internal vaginal assessment if agreeable    PT Home Exercise Plan [id number]    Consulted and Agree with Plan of Care Patient             Patient will benefit from skilled therapeutic intervention in order to improve the following deficits and impairments:  Decreased coordination, Decreased endurance, Impaired tone, Increased fascial restricitons, Pain, Impaired flexibility, Improper body mechanics, Postural dysfunction, Decreased strength, Decreased mobility  Visit Diagnosis: Muscle weakness (generalized) - Plan: PT plan of care cert/re-cert  Lack of coordination - Plan: PT plan of care cert/re-cert  Cramp and spasm  - Plan: PT plan of care cert/re-cert  Abnormality of gait and mobility - Plan: PT plan of care cert/re-cert  Abnormal posture - Plan: PT plan of care cert/re-cert  Mixed incontinence - Plan: PT plan of care cert/re-cert     Problem List Patient Active Problem List   Diagnosis Date Noted   Stress incontinence 11/18/2021   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   Gastroesophageal reflux disease without esophagitis 11/18/2021   Allergic rhinitis due to animal hair and dander 11/18/2021   Bone spur 08/31/2021   Stacy Gardner, PT, DPT 11/30/2309:00 AM      PHYSICAL THERAPY DISCHARGE SUMMARY  Visits from Start of Care: 1  Current functional level related to goals / functional outcomes: Unable to formally reassess due to pt calling to cancel rest of appointments due to other medical needs at this time.    Remaining deficits: Unable to formally reassess due to pt calling to cancel rest of appointments due to other medical needs at this time.      Education / Equipment: Limited due to pt only being present for one visit.    Patient agrees to discharge. Patient goals were not met. Patient is being discharged due to the patient's request.   [facility] @ [place], [place], [address] Phone: [phone]   Fax:  [phone]  Name: Vicki Newman MRN: [id number] Date of Birth: 14-Sep-1961

## 2021-11-30 NOTE — Patient Instructions (Signed)
[email or web address].com/[email or web address]   

## 2021-12-10 ENCOUNTER — Encounter: Payer: Self-pay | Admitting: Adult Health

## 2021-12-10 ENCOUNTER — Other Ambulatory Visit: Payer: Self-pay

## 2021-12-10 ENCOUNTER — Encounter: Payer: BC Managed Care – PPO | Admitting: Physical Therapy

## 2021-12-10 ENCOUNTER — Ambulatory Visit (INDEPENDENT_AMBULATORY_CARE_PROVIDER_SITE_OTHER): Payer: BC Managed Care – PPO | Admitting: Adult Health

## 2021-12-10 DIAGNOSIS — F4310 Post-traumatic stress disorder, unspecified: Secondary | ICD-10-CM | POA: Diagnosis not present

## 2021-12-10 DIAGNOSIS — F319 Bipolar disorder, unspecified: Secondary | ICD-10-CM

## 2021-12-10 DIAGNOSIS — G4700 Insomnia, unspecified: Secondary | ICD-10-CM | POA: Diagnosis not present

## 2021-12-10 DIAGNOSIS — F331 Major depressive disorder, recurrent, moderate: Secondary | ICD-10-CM

## 2021-12-10 DIAGNOSIS — Z79899 Other long term (current) drug therapy: Secondary | ICD-10-CM | POA: Diagnosis not present

## 2021-12-10 DIAGNOSIS — F411 Generalized anxiety disorder: Secondary | ICD-10-CM

## 2021-12-10 DIAGNOSIS — F429 Obsessive-compulsive disorder, unspecified: Secondary | ICD-10-CM

## 2021-12-10 NOTE — Progress Notes (Signed)
Vicki Newman [id number] [birth year] 61 y.o.  Subjective:   Patient ID:  Vicki Newman is a 61 y.o. (DOB [birth year]) female.  Chief Complaint: No chief complaint on file.   HPI Vicki Newman presents to the office today for follow-up of insomnia, PTSD, MDD, GAD, and BPD-1.  Describes mood today as "not too good". Pleasant. Tearful at times. Mood symptoms - reports depression, anxiety, and irritability. Increased worry and rumination. Stating "I feel better". Would like to work on getting her daytime anxiety down. Feels like medications are beneficial. Planning to visit over the Easter Holiday. Working with therapist. Decreased interest and motivation. Taking medications as prescribed Energy levels better. Active, not as much. Enjoys some usual interests and activities. Married. Lives with husband of 35 years. Has 3 children - daughters - 18, 61, 80. Spending time with family. Appetite adequate. Weight loss 130 from 135 pounds. Sleeping improved. Averages 8 hours of broken sleep. Focus and concentration difficulties. Completing tasks. Managing some aspects of household. 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 Worked as an Programmer, systems.  Denies SI or HI.  Denies AH or VH. [name] - online therapist - DBT.  Reports post partum x 3 pregnancies.   Previous medication trials:   Vyvanse - manic Wellbutrin - manic Zoloft - slugglish and no sex drive Nortriptyline - constipation Paxil - doesn't remember Prozac - unknown Abilify - akathesia Pristiq - don't remember Lunesta - don't remember Viibryd - N/V Lexapro - don't remember  Cymbalta - don't remember Effexor - don't remember Buspar - don't remember Luvox - don't remember Zyprexa - don't remember Topamax - don't remember Trileptal - balance issues Saphris - eyes not blinking Seroquel - constipation, brain fog   PHQ2-9    Flowsheet Row Office Visit from 11/18/2021 in [facility]  PHQ-2 Total Score 4   PHQ-9 Total Score 15        Review of Systems:  Review of Systems  Musculoskeletal:  Negative for gait problem.  Neurological:  Negative for tremors.  Psychiatric/Behavioral:         Please refer to HPI   Medications: I have reviewed the patient's current medications.  Current Outpatient Medications  Medication Sig Dispense Refill   B Complex-C (B-COMPLEX WITH VITAMIN C) tablet Take by mouth.     busPIRone (BUSPAR) 15 MG tablet Take 1 tablet (15 mg total) by mouth 3 (three) times daily. 90 tablet 5   Cholecalciferol 25 MCG (1000 UT) tablet Take by mouth.     clonazePAM (KLONOPIN) 0.5 MG tablet Take 0.5 mg by mouth daily as needed.     FLUoxetine (PROZAC) 10 MG capsule Take 10 mg by mouth daily.     FLUoxetine (PROZAC) 40 MG capsule Take 1 capsule (40 mg total) by mouth daily. 90 capsule 3   lamoTRIgine (LAMICTAL) 150 MG tablet Take 1 tablet (150 mg total) by mouth 2 (two) times daily. 180 tablet 1   levocetirizine (XYZAL) 5 MG tablet Take 1 tablet (5 mg total) by mouth every evening. 90 tablet 1   lithium carbonate (ESKALITH) 450 MG CR tablet Take 1 tablet (450 mg total) by mouth daily as needed. 90 tablet 1   Omega-3 1000 MG CAPS Take by mouth.     omeprazole (PRILOSEC) 20 MG capsule Take 1 capsule (20 mg total) by mouth daily. 90 capsule 1   QVAR REDIHALER 40 MCG/ACT inhaler Inhale into the lungs.     temazepam (RESTORIL) 15 MG capsule Take 1 capsule (15 mg total) by mouth  at bedtime as needed for sleep. 60 capsule 2   No current facility-administered medications for this visit.    Medication Side Effects: None  Allergies:  Allergies  Allergen Reactions   Sulfa Antibiotics Rash   Valacyclovir     Other reaction(s): Dizziness (intolerance)    Past Medical History:  Diagnosis Date   Allergy    Anxiety    Bipolar 1 disorder ([facility])    with mania   Bone spur    shoulder   Depression    Falls     Past Medical History, Surgical history, Social history, and Family  history were reviewed and updated as appropriate.   Please see review of systems for further details on the patient's review from today.   Objective:   Physical Exam:  There were no vitals taken for this visit.  Physical Exam Constitutional:      General: She is not in acute distress. Musculoskeletal:        General: No deformity.  Neurological:     Mental Status: She is alert and oriented to person, place, and time.     Coordination: Coordination normal.  Psychiatric:        Attention and Perception: Attention and perception normal. She does not perceive auditory or visual hallucinations.        Mood and Affect: Mood normal. Mood is not anxious or depressed. Affect is not labile, blunt, angry or inappropriate.        Speech: Speech normal.        Behavior: Behavior normal.        Thought Content: Thought content normal. Thought content is not paranoid or delusional. Thought content does not include homicidal or suicidal ideation. Thought content does not include homicidal or suicidal plan.        Cognition and Memory: Cognition and memory normal.        Judgment: Judgment normal.     Comments: Insight intact    Lab Review:     Component Value Date/Time   NA 145 (H) 09/02/2021 0744   K 4.5 09/02/2021 0744   CL 107 (H) 09/02/2021 0744   CO2 26 09/02/2021 0744   GLUCOSE 93 09/02/2021 0744   BUN 24 09/02/2021 0744   CREATININE 1.17 (H) 09/02/2021 0744   CALCIUM 10.7 (H) 09/02/2021 0744    No results found for: WBC, RBC, HGB, HCT, PLT, MCV, MCH, MCHC, RDW, LYMPHSABS, MONOABS, EOSABS, BASOSABS  Lithium Lvl  Date Value Ref Range Status  09/02/2021 0.7 0.5 - 1.2 mmol/L Final    Comment:    A concentration of 0.5-0.8 mmol/L is advised for long-term use; concentrations of up to 1.2 mmol/L may be necessary during acute treatment.                                  Detection Limit = 0.1                           <0.1 indicates None Detected      No results found for:  PHENYTOIN, PHENOBARB, VALPROATE, CBMZ   .res Assessment: Plan:    Plan:  PDMP reviewed  TAKING: Prozac 50mg  daily Lamictal 150mg  BID  Lithium 450mg  daily with dinner Buspar 15 mg TID - no longer taking Temazepam 7.5mg  at hs Melatonin 5mg  at hs  Lab slips given  Restarted Magnesium at bedtime  Not taking Clonazepam  0.5mg   - feeling tired  09/02/2021 Lithium level - 0.7  BMP - CR 1.17 TSH - 2.230  Seeing therapist and grief support.  Time spent with patient was minutes. Greater than 50% of face to face time with patient was spent on counseling and coordination of care.   RTC 4 weeks  Patient advised to contact office with any questions, adverse effects, or acute worsening in signs and symptoms.  Counseled patient regarding potential benefits, risks, and side effects of Lamictal to include potential risk of Stevens-Johnson syndrome. Advised patient to stop taking Lamictal and contact office immediately if rash develops and to seek urgent medical attention if rash is severe and/or spreading quickly.   Reviewed signs and symptoms of lithium toxicity, including; slurred speech, worsening tremors, nausea, vomiting, diarrhea, confusion and ataxia. Patient instructed to notify clinic if experiencing the symptoms or go to the ER if the symptoms are severe or occurring outside of office hours. Discussed potential drug interactions. Discussed hydration and risk of toxicity. Discussed avoiding diuretics or Motrin and advised patient to notify office if started on either. Discussed routine blood monitoring for renal and thyroid functioning due to potential long-term effects associated with lithium use.  Diagnoses and all orders for this visit:  High risk medication use -     TSH -     Basic metabolic panel -     TSH  Insomnia, unspecified type  Generalized anxiety disorder  PTSD (post-traumatic stress disorder)  Obsessive-compulsive disorder, unspecified type  Major depressive  disorder, recurrent episode, moderate (HCC)  Bipolar I disorder (HCC)     Please see After Visit Summary for patient specific instructions.  Future Appointments  Date Time Provider Department Center  01/07/2022 11:40 AM Renne Cornick, Thereasa Solo, NP [facility] None    Orders Placed This Encounter  Procedures   TSH   Basic metabolic panel   TSH    -------------------------------

## 2021-12-16 ENCOUNTER — Ambulatory Visit: Payer: BC Managed Care – PPO | Admitting: Adult Health

## 2021-12-17 ENCOUNTER — Other Ambulatory Visit: Payer: Self-pay

## 2021-12-17 ENCOUNTER — Encounter: Payer: Self-pay | Admitting: Adult Health

## 2021-12-17 ENCOUNTER — Ambulatory Visit: Payer: BC Managed Care – PPO | Admitting: Adult Health

## 2021-12-17 ENCOUNTER — Encounter: Payer: BC Managed Care – PPO | Admitting: Physical Therapy

## 2021-12-17 DIAGNOSIS — F411 Generalized anxiety disorder: Secondary | ICD-10-CM | POA: Diagnosis not present

## 2021-12-17 DIAGNOSIS — F331 Major depressive disorder, recurrent, moderate: Secondary | ICD-10-CM

## 2021-12-17 DIAGNOSIS — G4700 Insomnia, unspecified: Secondary | ICD-10-CM

## 2021-12-17 DIAGNOSIS — F319 Bipolar disorder, unspecified: Secondary | ICD-10-CM

## 2021-12-17 DIAGNOSIS — F4310 Post-traumatic stress disorder, unspecified: Secondary | ICD-10-CM

## 2021-12-17 MED ORDER — MIRTAZAPINE 15 MG PO TABS: 15.0000 mg | ORAL_TABLET | Freq: Every day | ORAL | 2 refills | Status: DC

## 2021-12-17 MED ORDER — BUSPIRONE HCL 10 MG PO TABS: 10.0000 mg | ORAL_TABLET | Freq: Two times a day (BID) | ORAL | 2 refills | Status: DC

## 2021-12-17 MED ORDER — FLUOXETINE HCL 10 MG PO CAPS: 10.0000 mg | ORAL_CAPSULE | Freq: Every day | ORAL | 2 refills | Status: DC

## 2021-12-17 NOTE — Progress Notes (Signed)
Vicki Newman [id number] 07/09/1961 61 y.o.  Subjective:   Patient ID:  Vicki Newman is a 61 y.o. (DOB August 16, 1961) female.  Chief Complaint: No chief complaint on file.   HPI Vicki Newman presents to the office today for follow-up of insomnia, PTSD, MDD, GAD, and BPD-1.  Describes mood today as "not too good". Pleasant. Tearful at times. Mood symptoms - reports depression, anxiety - "easily startled and jumpy" and irritability. Increased worry and rumination. Stating "my anxiety is causing a lot of issues". Having dry heaves in the morning whether she eats or not. Reports having this issue even when she was in college. Easily distracted. Having memory issues - putting something down and forgetting where she put. Forgetting things - last 4 numbers of phone number and zip code. Difficulties making decisions.Working with therapist. Decreased interest and motivation. Taking medications as prescribed Energy levels lower. Active, not as much. Enjoys some usual interests and activities. Married. Lives with husband of 35 years. Has 3 children - daughters - 29, 44, 85. Spending time with family. Appetite adequate. Weight loss 129 from 135 pounds. Sleeping difficulites - it varies Averages 6 hours of broken sleep. Waking up early morning.  Focus and concentration difficulties. Completing tasks. Managing some aspects of household. 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 Worked as an Programmer, systems.  Denies SI or HI.  Denies AH or VH. [name] - online therapist - DBT.  Reports post partum x 3 pregnancies.   Previous medication trials:   Vyvanse - manic Wellbutrin - manic Zoloft - slugglish and no sex drive Nortriptyline - constipation Paxil - doesn't remember Prozac - unknown Abilify - akathesia Pristiq - don't remember Lunesta - don't remember Viibryd - N/V Lexapro - don't remember  Cymbalta - don't remember Effexor - don't remember Buspar - don't remember Luvox - don't remember Zyprexa  - don't remember Topamax - don't remember Trileptal - balance issues Saphris - eyes not blinking Seroquel - constipation, brain fog    PHQ2-9    Flowsheet Row Office Visit from 11/18/2021 in [facility]  PHQ-2 Total Score 4  PHQ-9 Total Score 15        Review of Systems:  Review of Systems  Musculoskeletal:  Negative for gait problem.  Neurological:  Negative for tremors.  Psychiatric/Behavioral:         Please refer to HPI   Medications: I have reviewed the patient's current medications.  Current Outpatient Medications  Medication Sig Dispense Refill   mirtazapine (REMERON) 15 MG tablet Take 1 tablet (15 mg total) by mouth at bedtime. 30 tablet 2   B Complex-C (B-COMPLEX WITH VITAMIN C) tablet Take by mouth.     busPIRone (BUSPAR) 10 MG tablet Take 1 tablet (10 mg total) by mouth 2 (two) times daily. 60 tablet 2   Cholecalciferol 25 MCG (1000 UT) tablet Take by mouth.     clonazePAM (KLONOPIN) 0.5 MG tablet Take 0.5 mg by mouth daily as needed.     FLUoxetine (PROZAC) 10 MG capsule Take 1 capsule (10 mg total) by mouth daily. 30 capsule 2   FLUoxetine (PROZAC) 40 MG capsule Take 1 capsule (40 mg total) by mouth daily. 90 capsule 3   lamoTRIgine (LAMICTAL) 150 MG tablet Take 1 tablet (150 mg total) by mouth 2 (two) times daily. 180 tablet 1   levocetirizine (XYZAL) 5 MG tablet Take 1 tablet (5 mg total) by mouth every evening. 90 tablet 1   lithium carbonate (ESKALITH) 450 MG CR tablet Take 1  tablet (450 mg total) by mouth daily as needed. 90 tablet 1   Omega-3 1000 MG CAPS Take by mouth.     omeprazole (PRILOSEC) 20 MG capsule Take 1 capsule (20 mg total) by mouth daily. 90 capsule 1   QVAR REDIHALER 40 MCG/ACT inhaler Inhale into the lungs.     temazepam (RESTORIL) 15 MG capsule Take 1 capsule (15 mg total) by mouth at bedtime as needed for sleep. 60 capsule 2   No current facility-administered medications for this visit.    Medication Side  Effects: None  Allergies:  Allergies  Allergen Reactions   Sulfa Antibiotics Rash   Valacyclovir     Other reaction(s): Dizziness (intolerance)    Past Medical History:  Diagnosis Date   Allergy    Anxiety    Bipolar 1 disorder (HCC)    with mania   Bone spur    shoulder   Depression    Falls     Past Medical History, Surgical history, Social history, and Family history were reviewed and updated as appropriate.   Please see review of systems for further details on the patient's review from today.   Objective:   Physical Exam:  There were no vitals taken for this visit.  Physical Exam Constitutional:      General: She is not in acute distress. Musculoskeletal:        General: No deformity.  Neurological:     Mental Status: She is alert and oriented to person, place, and time.     Coordination: Coordination normal.  Psychiatric:        Attention and Perception: Attention and perception normal. She does not perceive auditory or visual hallucinations.        Mood and Affect: Mood normal. Mood is not anxious or depressed. Affect is not labile, blunt, angry or inappropriate.        Speech: Speech normal.        Behavior: Behavior normal.        Thought Content: Thought content normal. Thought content is not paranoid or delusional. Thought content does not include homicidal or suicidal ideation. Thought content does not include homicidal or suicidal plan.        Cognition and Memory: Cognition and memory normal.        Judgment: Judgment normal.     Comments: Insight intact    Lab Review:     Component Value Date/Time   NA 145 (H) 09/02/2021 0744   K 4.5 09/02/2021 0744   CL 107 (H) 09/02/2021 0744   CO2 26 09/02/2021 0744   GLUCOSE 93 09/02/2021 0744   BUN 24 09/02/2021 0744   CREATININE 1.17 (H) 09/02/2021 0744   CALCIUM 10.7 (H) 09/02/2021 0744    No results found for: WBC, RBC, HGB, HCT, PLT, MCV, MCH, MCHC, RDW, LYMPHSABS, MONOABS, EOSABS,  BASOSABS  Lithium Lvl  Date Value Ref Range Status  09/02/2021 0.7 0.5 - 1.2 mmol/L Final    Comment:    A concentration of 0.5-0.8 mmol/L is advised for long-term use; concentrations of up to 1.2 mmol/L may be necessary during acute treatment.                                  Detection Limit = 0.1                           <0.1 indicates  None Detected      No results found for: PHENYTOIN, PHENOBARB, VALPROATE, CBMZ   .res Assessment: Plan:    Plan:  PDMP reviewed  TAKING: Prozac 50mg  daily Lamictal 150mg  BID  Lithium 450mg  daily with dinner Buspar 10 mg BID    ADD Remeron 15mg  at bedtime  D/C  Temazepam 7.5mg  at hs Melatonin 5mg  at hs  Labs done 12/17/2021  Restarted Magnesium at bedtime  Not taking Clonazepam 0.5mg   - feeling tired  09/02/2021 Lithium level - 0.7  BMP - CR 1.17 TSH - 2.230  Seeing therapist and grief support.  Time spent with patient was minutes. Greater than 50% of face to face time with patient was spent on counseling and coordination of care.   RTC 4 weeks  Patient advised to contact office with any questions, adverse effects, or acute worsening in signs and symptoms.  Counseled patient regarding potential benefits, risks, and side effects of Lamictal to include potential risk of Stevens-Johnson syndrome. Advised patient to stop taking Lamictal and contact office immediately if rash develops and to seek urgent medical attention if rash is severe and/or spreading quickly.   Reviewed signs and symptoms of lithium toxicity, including; slurred speech, worsening tremors, nausea, vomiting, diarrhea, confusion and ataxia. Patient instructed to notify clinic if experiencing the symptoms or go to the ER if the symptoms are severe or occurring outside of office hours. Discussed potential drug interactions. Discussed hydration and risk of toxicity. Discussed avoiding diuretics or Motrin and advised patient to notify office if started on either.  Discussed routine blood monitoring for renal and thyroid functioning due to potential long-term effects associated with lithium use.  Diagnoses and all orders for this visit:  Insomnia, unspecified type -     mirtazapine (REMERON) 15 MG tablet; Take 1 tablet (15 mg total) by mouth at bedtime.  Generalized anxiety disorder -     FLUoxetine (PROZAC) 10 MG capsule; Take 1 capsule (10 mg total) by mouth daily. -     busPIRone (BUSPAR) 10 MG tablet; Take 1 tablet (10 mg total) by mouth 2 (two) times daily.  Bipolar I disorder (HCC) -     busPIRone (BUSPAR) 10 MG tablet; Take 1 tablet (10 mg total) by mouth 2 (two) times daily.  Major depressive disorder, recurrent episode, moderate (HCC) -     FLUoxetine (PROZAC) 10 MG capsule; Take 1 capsule (10 mg total) by mouth daily.  PTSD (post-traumatic stress disorder)     Please see After Visit Summary for patient specific instructions.  Future Appointments  Date Time Provider Department Center  01/07/2022 11:40 AM Courteney Alderete, , NP [facility] None    No orders of the defined types were placed in this encounter.   -------------------------------

## 2021-12-18 LAB — BASIC METABOLIC PANEL
BUN/Creatinine Ratio: 22 (calc) (ref 6–22)
BUN: 24 mg/dL (ref 7–25)
CO2: 28 mmol/L (ref 20–32)
Calcium: 10.6 mg/dL — ABNORMAL HIGH (ref 8.6–10.4)
Chloride: 109 mmol/L (ref 98–110)
Creat: 1.1 mg/dL — ABNORMAL HIGH (ref 0.50–1.05)
Glucose, Bld: 94 mg/dL (ref 65–99)
Potassium: 4.7 mmol/L (ref 3.5–5.3)
Sodium: 145 mmol/L (ref 135–146)

## 2021-12-18 LAB — TSH: TSH: 2.31 mIU/L (ref 0.40–4.50)

## 2021-12-22 ENCOUNTER — Ambulatory Visit (INDEPENDENT_AMBULATORY_CARE_PROVIDER_SITE_OTHER): Payer: BC Managed Care – PPO | Admitting: Family

## 2021-12-22 ENCOUNTER — Other Ambulatory Visit: Payer: Self-pay

## 2021-12-22 ENCOUNTER — Encounter: Payer: Self-pay | Admitting: Family

## 2021-12-22 VITALS — BP 111/75 | HR 74 | Temp 98.1°F | Ht 65.0 in | Wt 133.8 lb

## 2021-12-22 DIAGNOSIS — S060X0A Concussion without loss of consciousness, initial encounter: Secondary | ICD-10-CM

## 2021-12-22 DIAGNOSIS — R42 Dizziness and giddiness: Secondary | ICD-10-CM | POA: Diagnosis not present

## 2021-12-22 MED ORDER — MECLIZINE HCL 12.5 MG PO TABS: 12.5000 mg | ORAL_TABLET | Freq: Three times a day (TID) | ORAL | 0 refills | Status: DC | PRN

## 2021-12-22 NOTE — Progress Notes (Signed)
Subjective:     Patient ID: Vicki Newman, female    DOB: [birth year], 61 y.o.   MRN: [id number]  Chief Complaint  Patient presents with   Fall    Pt says that she had a bad fall in the last month, she has since experienced headaches, nausea and vomiting. Denies fever or bleeding.    Nausea   Emesis   Headache   HPI: Vertigo Patient presents for evaluation of dizziness. The symptoms started a week ago and have  been intermittent, but persistent . The attacks occur intermittently and last 20 minutes. Positions that worsen symptoms: any motion. Previous workup/treatments: none. Associated ear symptoms: none. Associated CNS symptoms: none. Recent infections: none. Head trauma: 1week ago , pt fell against dresser during the night . Drug ingestion: sedatives - Restoril for sleep, pt thinks she may have taken 2 pills instead of 1.  There are no preventive care reminders to display for this patient.  Past Medical History:  Diagnosis Date   Allergy    Anxiety    Bipolar 1 disorder (HCC)    with mania   Bone spur    shoulder   Depression    Falls     Past Surgical History:  Procedure Laterality Date   HERNIA REPAIR  2000    Outpatient Medications Prior to Visit  Medication Sig Dispense Refill   B Complex-C (B-COMPLEX WITH VITAMIN C) tablet Take by mouth.     busPIRone (BUSPAR) 10 MG tablet Take 1 tablet (10 mg total) by mouth 2 (two) times daily. 60 tablet 2   Cholecalciferol 25 MCG (1000 UT) tablet Take by mouth.     clonazePAM (KLONOPIN) 0.5 MG tablet Take 0.5 mg by mouth daily as needed.     FLUoxetine (PROZAC) 10 MG capsule Take 1 capsule (10 mg total) by mouth daily. 30 capsule 2   FLUoxetine (PROZAC) 40 MG capsule Take 1 capsule (40 mg total) by mouth daily. 90 capsule 3   lamoTRIgine (LAMICTAL) 150 MG tablet Take 1 tablet (150 mg total) by mouth 2 (two) times daily. 180 tablet 1   levocetirizine (XYZAL) 5 MG tablet Take 1 tablet (5 mg total) by mouth every evening. 90  tablet 1   lithium carbonate (ESKALITH) 450 MG CR tablet Take 1 tablet (450 mg total) by mouth daily as needed. 90 tablet 1   mirtazapine (REMERON) 15 MG tablet Take 1 tablet (15 mg total) by mouth at bedtime. 30 tablet 2   Omega-3 1000 MG CAPS Take by mouth.     omeprazole (PRILOSEC) 20 MG capsule Take 1 capsule (20 mg total) by mouth daily. 90 capsule 1   QVAR REDIHALER 40 MCG/ACT inhaler Inhale into the lungs.     temazepam (RESTORIL) 15 MG capsule Take 1 capsule (15 mg total) by mouth at bedtime as needed for sleep. (Patient not taking: Reported on 12/22/2021) 60 capsule 2   No facility-administered medications prior to visit.    Allergies  Allergen Reactions   Sulfa Antibiotics Rash   Valacyclovir     Other reaction(s): Dizziness (intolerance)        Objective:    Physical Exam Vitals and nursing note reviewed.  Constitutional:      Appearance: Normal appearance.  HENT:     Right Ear: Tympanic membrane and ear canal normal.     Left Ear: Tympanic membrane and ear canal normal.  Eyes:     General: Vision grossly intact.     Extraocular Movements:  Right eye: Normal extraocular motion.     Left eye: Normal extraocular motion.     Conjunctiva/sclera: Conjunctivae normal.     Comments: pt reports dizziness with EOM exam  Cardiovascular:     Rate and Rhythm: Normal rate and regular rhythm.  Pulmonary:     Effort: Pulmonary effort is normal.     Breath sounds: Normal breath sounds.  Musculoskeletal:        General: Normal range of motion.  Skin:    General: Skin is warm and dry.  Neurological:     Mental Status: She is alert and oriented to person, place, and time.     Cranial Nerves: Cranial nerves 2-12 are intact.     Sensory: Sensation is intact.     Motor: Motor function is intact.     Coordination: Coordination is intact.     Gait: Gait is intact.  Psychiatric:        Mood and Affect: Mood normal.        Behavior: Behavior normal.    BP 111/75    Pulse  74    Temp 98.1 F (36.7 C) (Temporal)    Ht 5\' 5"  (1.651 m)    Wt 133 lb 12.8 oz (60.7 kg)    SpO2 97%    BMI 22.27 kg/m  Wt Readings from Last 3 Encounters:  12/22/21 133 lb 12.8 oz (60.7 kg)  11/18/21 135 lb (61.2 kg)       Assessment & Plan:   Problem List Items Addressed This Visit   None Visit Diagnoses     Concussion without loss of consciousness, initial encounter    -  Primary   pt reports waking up a week ago, felt very dizzy & fell against her dresser. She thinks she took 2 Restoril pills instead of 1.  All that day she c/o nausea, 2 episodes of vomiting, headache, and dizziness. Reports sx have continued daily to a lesser degree, but still present and she is concerned there is something seriously wrong. She denies any pain or lump on right side of head where she fell, none found on exam. Will order head CT scan to r/o any new anomaly.  Relevant Orders   CT HEAD WO CONTRAST (11/20/21)   Dizziness       pt reports having since she fell a week ago. Believe related to an episode of vertigo that probably led to her falling and possible concussion, pt reports having vertigo in the past and has taken Meclizine in past. Refilling RX today, advised on use & SE.  Relevant Medications   meclizine (ANTIVERT) 12.5 MG tablet      Meds ordered this encounter  Medications   meclizine (ANTIVERT) 12.5 MG tablet    Sig: Take 1 tablet (12.5 mg total) by mouth 3 (three) times daily as needed for dizziness.    Dispense:  30 tablet    Refill:  0    Order Specific Question:   Supervising Provider    Answer:   ANDY, CAMILLE L [[id number]]

## 2021-12-22 NOTE — Patient Instructions (Addendum)
It was very nice to see you today!  I have ordered a CT scan of your head, the imaging office will call you directly to schedule. I will notify you with the results as soon as I can. I sent Meclizine to your pharmacy, start this today to treat your dizziness, can take up to 3 times per day if feeling dizzy or nauseous. See the handout attached on concussion protocol. Let me know if your symptoms are worsening, it can take several weeks to fully recover.    PLEASE NOTE:  If you had any lab tests please let us know if you have not heard back within a few days. You may see your results on MyChart before we have a chance to review them but we will give you a call once they are reviewed by [place]. If we ordered any referrals today, please let us know if you have not heard from their office within the next week.   Please try these tips to maintain a healthy lifestyle:  Eat most of your calories during the day when you are active. Eliminate processed foods including packaged sweets (pies, cakes, cookies), reduce intake of potatoes, white bread, white pasta, and white rice. Look for whole grain options, oat flour or almond flour.  Each meal should contain half fruits/vegetables, one quarter protein, and one quarter carbs (no bigger than a computer mouse).  Cut down on sweet beverages. This includes juice, soda, and sweet tea. Also watch fruit intake, though this is a healthier sweet option, it still contains natural sugar! Limit to 3 servings daily.  Drink at least 1 glass of water with each meal and aim for at least 8 glasses per day  Exercise at least 150 minutes every week.

## 2021-12-24 ENCOUNTER — Encounter: Payer: BC Managed Care – PPO | Admitting: Physical Therapy

## 2021-12-26 ENCOUNTER — Other Ambulatory Visit: Payer: Self-pay

## 2021-12-26 ENCOUNTER — Ambulatory Visit (HOSPITAL_BASED_OUTPATIENT_CLINIC_OR_DEPARTMENT_OTHER)
Admission: RE | Admit: 2021-12-26 | Discharge: 2021-12-26 | Disposition: A | Discharge status: Home / Self Care | Payer: BC Managed Care – PPO | Source: Ambulatory Visit | Attending: Family | Admitting: Family

## 2021-12-26 DIAGNOSIS — S060X0A Concussion without loss of consciousness, initial encounter: Secondary | ICD-10-CM

## 2021-12-31 ENCOUNTER — Encounter: Payer: BC Managed Care – PPO | Admitting: Physical Therapy

## 2022-01-04 ENCOUNTER — Other Ambulatory Visit: Payer: Self-pay | Admitting: Adult Health

## 2022-01-04 ENCOUNTER — Telehealth: Payer: Self-pay | Admitting: Adult Health

## 2022-01-04 DIAGNOSIS — Z79899 Other long term (current) drug therapy: Secondary | ICD-10-CM

## 2022-01-04 NOTE — Telephone Encounter (Signed)
Vicki Newman needs to have a Lithium test done before her appt on Thursday 3/9. Could this order be faxed to [phone] as she is not sure which lab she will be going to? Her phone number is [phone]. ?

## 2022-01-04 NOTE — Telephone Encounter (Signed)
Patient left a message that she is coming to pick up a lab order in about an hour. I haven't talked with her to get a lab preference, but a previous order was sent to [facility].  ?

## 2022-01-04 NOTE — Telephone Encounter (Signed)
There is no pending or standing lithium level is labs.  ?

## 2022-01-04 NOTE — Telephone Encounter (Signed)
We can order a lithium level. We can fax order to the location she goes to if it is outside of the standard labs we use.

## 2022-01-04 NOTE — Telephone Encounter (Signed)
Pt requesting to pick up a copy of lab orders. Printed and at front desk. ?

## 2022-01-04 NOTE — Telephone Encounter (Signed)
LVM for patient to [facility] in reference to lab.  ?

## 2022-01-04 NOTE — Telephone Encounter (Signed)
Noted  

## 2022-01-04 NOTE — Telephone Encounter (Signed)
There is no lab order. One needs to be put in.  ?

## 2022-01-04 NOTE — Telephone Encounter (Signed)
So you have a copy of the lab this morning?

## 2022-01-06 ENCOUNTER — Encounter: Payer: Self-pay | Admitting: Adult Health

## 2022-01-07 ENCOUNTER — Other Ambulatory Visit: Payer: Self-pay | Admitting: Adult Health

## 2022-01-07 ENCOUNTER — Encounter: Payer: Self-pay | Admitting: Adult Health

## 2022-01-07 ENCOUNTER — Other Ambulatory Visit: Payer: Self-pay

## 2022-01-07 ENCOUNTER — Encounter: Payer: BC Managed Care – PPO | Admitting: Physical Therapy

## 2022-01-07 ENCOUNTER — Ambulatory Visit (INDEPENDENT_AMBULATORY_CARE_PROVIDER_SITE_OTHER): Payer: BC Managed Care – PPO | Admitting: Adult Health

## 2022-01-07 DIAGNOSIS — F319 Bipolar disorder, unspecified: Secondary | ICD-10-CM

## 2022-01-07 DIAGNOSIS — G4700 Insomnia, unspecified: Secondary | ICD-10-CM

## 2022-01-07 DIAGNOSIS — F411 Generalized anxiety disorder: Secondary | ICD-10-CM

## 2022-01-07 DIAGNOSIS — F331 Major depressive disorder, recurrent, moderate: Secondary | ICD-10-CM | POA: Diagnosis not present

## 2022-01-07 MED ORDER — FLUOXETINE HCL 20 MG PO CAPS: 20.0000 mg | ORAL_CAPSULE | Freq: Every day | ORAL | 3 refills | Status: DC

## 2022-01-07 MED ORDER — BUSPIRONE HCL 10 MG PO TABS: 10.0000 mg | ORAL_TABLET | Freq: Three times a day (TID) | ORAL | 5 refills | Status: DC

## 2022-01-07 MED ORDER — MIRTAZAPINE 7.5 MG PO TABS: 15.0000 mg | ORAL_TABLET | Freq: Every day | ORAL | 2 refills | Status: DC

## 2022-01-07 NOTE — Progress Notes (Signed)
Vicki HalimJacqueline Brodman [id number] September 06, 1961 61 y.o.  Subjective:   Patient ID:  Vicki Newman is a 61 y.o. (DOB September 06, 1961) female.  Chief Complaint: No chief complaint on file.   HPI Vicki Newman presents to the office today for follow-up of insomnia, PTSD, MDD, GAD, and BPD-1.  Describes mood today as "a little off". Pleasant. Tearful at times. Mood symptoms - reports depression - "creeping in". Feels irritable at times. Feeling more anxious overall. Not waking up in a panic. Reports worry - questioning things. Easily distracted. Reports forgetfulness. Having memory issues - putting something down and forgetting where she put. Not as forgetful. Working with therapist. Decreased interest and motivation. Taking medications as prescribed. Energy levels pretty good. Active, has started exercising again. . Enjoys some usual interests and activities. Married. Lives with husband of 35 years. Has 3 children - daughters - [id number], 1624, 4422. Spending time with family. Appetite adequate - feels hungry. Weight stable 130 pounds. Sleeping well most nights. Averages 8 to 9 of broken sleep.  Focus and concentration difficulties. Completing tasks. Managing some aspects of household. 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 Worked as an Programmer, systemseducator.  Denies SI or HI.  Denies AH or VH. Vicki Newman - online therapist - DBT.  Reports post partum x 3 pregnancies.   Previous medication trials:   Vyvanse - manic Wellbutrin - manic Zoloft - slugglish and no sex drive Nortriptyline - constipation Paxil - doesn't remember Prozac - unknown Abilify - akathesia Pristiq - don't remember Lunesta - don't remember Viibryd - N/V Lexapro - don't remember  Cymbalta - don't remember Effexor - don't remember Buspar - don't remember Luvox - don't remember Zyprexa - don't remember Topamax - don't remember Trileptal - balance issues Saphris - eyes not blinking Seroquel - constipation, brain fog   PHQ2-9    Flowsheet Row  [facility]/18/20[facility]  PHQ-2 Total Score 4  PHQ-9 Total Score 15        Review of Systems:  Review of Systems  Musculoskeletal:  Negative for gait problem.  Neurological:  Negative for tremors.  Psychiatric/Behavioral:         Please refer to HPI   Medications: I have reviewed the patient's current medications.  Current Outpatient Medications  Medication Sig Dispense Refill   B Complex-C (B-COMPLEX WITH VITAMIN C) tablet Take by mouth.     busPIRone (BUSPAR) 10 MG tablet Take 1 tablet (10 mg total) by mouth 3 (three) times daily. 90 tablet 5   Cholecalciferol 25 MCG (1000 UT) tablet Take by mouth.     clonazePAM (KLONOPIN) 0.5 MG tablet Take 0.5 mg by mouth daily as needed.     FLUoxetine (PROZAC) 20 MG capsule Take 1 capsule (20 mg total) by mouth daily. 90 capsule 3   FLUoxetine (PROZAC) 40 MG capsule Take 1 capsule (40 mg total) by mouth daily. 90 capsule 3   lamoTRIgine (LAMICTAL) 150 MG tablet Take 1 tablet (150 mg total) by mouth 2 (two) times daily. 180 tablet 1   levocetirizine (XYZAL) 5 MG tablet Take 1 tablet (5 mg total) by mouth every evening. 90 tablet 1   lithium carbonate (ESKALITH) 450 MG CR tablet Take 1 tablet (450 mg total) by mouth daily as needed. 90 tablet 1   meclizine (ANTIVERT) 12.5 MG tablet Take 1 tablet (12.5 mg total) by mouth 3 (three) times daily as needed for dizziness. 30 tablet 0   mirtazapine (REMERON) 7.5 MG tablet Take 2 tablets (15 mg  total) by mouth at bedtime. 30 tablet 2   Omega-3 1000 MG CAPS Take by mouth.     omeprazole (PRILOSEC) 20 MG capsule Take 1 capsule (20 mg total) by mouth daily. 90 capsule 1   QVAR REDIHALER 40 MCG/ACT inhaler Inhale into the lungs.     No current facility-administered medications for this visit.    Medication Side Effects: None  Allergies:  Allergies  Allergen Reactions   Sulfa Antibiotics Rash   Valacyclovir     Other reaction(s): Dizziness (intolerance)     Past Medical History:  Diagnosis Date   Allergy    Anxiety    Bipolar 1 disorder (HCC)    with mania   Bone spur    shoulder   Depression    Falls     Past Medical History, Surgical history, Social history, and Family history were reviewed and updated as appropriate.   Please see review of systems for further details on the patient's review from today.   Objective:   Physical Exam:  There were no vitals taken for this visit.  Physical Exam Constitutional:      General: She is not in acute distress. Musculoskeletal:        General: No deformity.  Neurological:     Mental Status: She is alert and oriented to person, place, and time.     Coordination: Coordination normal.  Psychiatric:        Attention and Perception: Attention and perception normal. She does not perceive auditory or visual hallucinations.        Mood and Affect: Mood normal. Mood is not anxious or depressed. Affect is not labile, blunt, angry or inappropriate.        Speech: Speech normal.        Behavior: Behavior normal.        Thought Content: Thought content normal. Thought content is not paranoid or delusional. Thought content does not include homicidal or suicidal ideation. Thought content does not include homicidal or suicidal plan.        Cognition and Memory: Cognition and memory normal.        Judgment: Judgment normal.     Comments: Insight intact    Lab Review:     Component Value Date/Time   NA 145 12/17/2021 0836   NA 145 (H) 09/02/2021 0744   K 4.7 12/17/2021 0836   CL 109 12/17/2021 0836   CO2 28 12/17/2021 0836   GLUCOSE 94 12/17/2021 0836   BUN 24 12/17/2021 0836   BUN 24 09/02/2021 0744   CREATININE 1.10 (H) 12/17/2021 0836   CALCIUM 10.6 (H) 12/17/2021 0836    No results found for: WBC, RBC, HGB, HCT, PLT, MCV, MCH, MCHC, RDW, LYMPHSABS, MONOABS, EOSABS, BASOSABS  Lithium Lvl  Date Value Ref Range Status  09/02/2021 0.7 0.5 - 1.2 mmol/L Final    Comment:    A  concentration of 0.5-0.8 mmol/L is advised for long-term use; concentrations of up to 1.2 mmol/L may be necessary during acute treatment.                                  Detection Limit = 0.1                           <0.1 indicates None Detected      No results found for: PHENYTOIN, PHENOBARB, VALPROATE, CBMZ   .  res Assessment: Plan:    Plan:  PDMP reviewed  TAKING: Increase Prozac 50mg  to 60mg  daily Lamictal 150mg  BID  Lithium 450mg  daily with dinner Increase Buspar 10 mg BID to TID Decrease Remeron 15mg  at bedtime to 7.5mg   Restarted Magnesium at bedtime  Not taking Clonazepam 0.5mg   - feeling tired  09/02/2021 BMP - CR 1.17 TSH - 2.230  01/05/2022 Lithium level - 0.5   Seeing therapist weekly - and grief support.  Time spent with patient was minutes. Greater than 50% of face to face time with patient was spent on counseling and coordination of care.   RTC 8 weeks  Patient advised to contact office with any questions, adverse effects, or acute worsening in signs and symptoms.  Counseled patient regarding potential benefits, risks, and side effects of Lamictal to include potential risk of Stevens-Johnson syndrome. Advised patient to stop taking Lamictal and contact office immediately if rash develops and to seek urgent medical attention if rash is severe and/or spreading quickly.   Reviewed signs and symptoms of lithium toxicity, including; slurred speech, worsening tremors, nausea, vomiting, diarrhea, confusion and ataxia. Patient instructed to notify clinic if experiencing the symptoms or go to the ER if the symptoms are severe or occurring outside of office hours. Discussed potential drug interactions. Discussed hydration and risk of toxicity. Discussed avoiding diuretics or Motrin and advised patient to notify office if started on either. Discussed routine blood monitoring for renal and thyroid functioning due to potential long-term effects associated  with lithium use.   Diagnoses and all orders for this visit:  Generalized anxiety disorder -     busPIRone (BUSPAR) 10 MG tablet; Take 1 tablet (10 mg total) by mouth 3 (three) times daily. -     FLUoxetine (PROZAC) 20 MG capsule; Take 1 capsule (20 mg total) by mouth daily.  Bipolar I disorder (HCC) -     busPIRone (BUSPAR) 10 MG tablet; Take 1 tablet (10 mg total) by mouth 3 (three) times daily.  Insomnia, unspecified type -     mirtazapine (REMERON) 7.5 MG tablet; Take 2 tablets (15 mg total) by mouth at bedtime.  Major depressive disorder, recurrent episode, moderate (HCC) -     FLUoxetine (PROZAC) 20 MG capsule; Take 1 capsule (20 mg total) by mouth daily.     Please see After Visit Summary for patient specific instructions.  No future appointments.   No orders of the defined types were placed in this encounter.   -------------------------------

## 2022-01-08 NOTE — Telephone Encounter (Signed)
She is taking the 7.5 at hs.

## 2022-01-08 NOTE — Telephone Encounter (Signed)
Sent as 7.5, 2 qd, but only #30. Needs to be 60 or change to 15 mg.  ?

## 2022-01-15 ENCOUNTER — Encounter: Payer: Self-pay | Admitting: Gastroenterology

## 2022-01-25 ENCOUNTER — Other Ambulatory Visit: Payer: Self-pay | Admitting: Adult Health

## 2022-01-25 DIAGNOSIS — F319 Bipolar disorder, unspecified: Secondary | ICD-10-CM

## 2022-02-03 ENCOUNTER — Ambulatory Visit: Payer: BC Managed Care – PPO | Admitting: Gastroenterology

## 2022-02-04 ENCOUNTER — Ambulatory Visit: Payer: BC Managed Care – PPO | Admitting: Adult Health

## 2022-02-11 ENCOUNTER — Telehealth (HOSPITAL_COMMUNITY): Payer: Self-pay | Admitting: Licensed Clinical Social Worker

## 2022-02-23 ENCOUNTER — Other Ambulatory Visit (INDEPENDENT_AMBULATORY_CARE_PROVIDER_SITE_OTHER): Payer: BC Managed Care – PPO

## 2022-02-23 ENCOUNTER — Ambulatory Visit (INDEPENDENT_AMBULATORY_CARE_PROVIDER_SITE_OTHER): Payer: BC Managed Care – PPO | Admitting: Gastroenterology

## 2022-02-23 ENCOUNTER — Encounter: Payer: Self-pay | Admitting: Gastroenterology

## 2022-02-23 VITALS — BP 108/66 | HR 65 | Ht 65.0 in | Wt 134.0 lb

## 2022-02-23 DIAGNOSIS — K7689 Other specified diseases of liver: Secondary | ICD-10-CM

## 2022-02-23 LAB — HEPATIC FUNCTION PANEL
ALT: 16 U/L (ref 0–35)
AST: 13 U/L (ref 0–37)
Albumin: 4.7 g/dL (ref 3.5–5.2)
Alkaline Phosphatase: 104 U/L (ref 39–117)
Bilirubin, Direct: 0.1 mg/dL (ref 0.0–0.3)
Total Bilirubin: 0.5 mg/dL (ref 0.2–1.2)
Total Protein: 7.3 g/dL (ref 6.0–8.3)

## 2022-02-23 NOTE — Patient Instructions (Signed)
If you are age 60 or older, your body mass index should be between 23-30. Your Body mass index is 22.3 kg/m?Marland Kitchen If this is out of the aforementioned range listed, please consider follow up with your Primary Care Provider. ? ?If you are age 26 or younger, your body mass index should be between 19-25. Your Body mass index is 22.3 kg/m?Marland Kitchen If this is out of the aformentioned range listed, please consider follow up with your Primary Care Provider.  ? ?________________________________________________________ ? ?The [facility] providers would like to encourage you to use [facility] to communicate with providers for non-urgent requests or questions.  Due to long hold times on the telephone, sending your provider a message by [facility] may be a faster and more efficient way to get a response.  Please allow 48 business hours for a response.  Please remember that this is for non-urgent requests.  ?_______________________________________________________ ? ?Your provider has requested that you go to the basement level for lab work before leaving today. Press "B" on the elevator. The lab is located at the first door on the left as you exit the elevator. ? ?You have been scheduled for an MRI at [facility] on 03-03-2022. Your appointment time is 10:00am. Please arrive to admitting (at main entrance of the hospital) 15 minutes prior to your appointment time for registration purposes. Please make certain not to have anything to eat or drink 4 hours prior to your test. In addition, if you have any metal in your body, have a pacemaker or defibrillator, please be sure to let your ordering physician know. This test typically takes 45 minutes to 1 hour to complete. Should you need to reschedule, please call [phone] to do so.  ? ?Due to recent changes in healthcare laws, you may see the results of your imaging and laboratory studies on MyChart before your provider has had a chance to review them.  We understand that in some cases  there may be results that are confusing or concerning to you. Not all laboratory results come back in the same time frame and the provider may be waiting for multiple results in order to interpret others.  Please give [place] 48 hours in order for your provider to thoroughly review all the results before contacting the office for clarification of your results.  ? ?It was a pleasure to see you today! ? ?Thank you for trusting me with your gastrointestinal care!   ? ? ?

## 2022-02-23 NOTE — Progress Notes (Signed)
? ? ?[facility] Consult Note: ? ?History: ?Vicki Newman ?02/23/2022 ? ?Referring provider: Jeanie Sewer, NP ? ?Reason for consult/chief complaint: Hepatic cyst (Patient had MRI a year ago that showed liver cysts. Patient denies any GI sx at this time. Patient was told to f/u in one year from MRI.) ? ? ?Subjective  ?HPI: ? ?This is the initial clinic visit for a delightful 61 year old woman referred by primary care for history of hepatic cyst.  She was living in [place] and underwent chest or abdominal imaging that discovered hepatic cysts.  MRI was done in December 2021 with results as below.  She feels well with no abdominal pain, nausea vomiting altered bowel habits or rectal bleeding.  She believes she had a colonoscopy in that area 7 or 8 years ago and thinks she may be able to get the report through that portal.  She recalls being normal and that she should return in 10 years. ?No family history of particular liver diseases. ? ? ?ROS: ? ?Review of Systems ?She denies chest pain dyspnea or dysuria  ?Mood stable ? ?Past Medical History: ?Past Medical History:  ?Diagnosis Date  ? Allergy   ? Anxiety   ? Bipolar 1 disorder ([place])   ? with mania  ? Bone spur   ? shoulder  ? Depression   ? Falls   ? ? ? ?Past Surgical History: ?Past Surgical History:  ?Procedure Laterality Date  ? HERNIA REPAIR  2000  ? ? ? ?Family History: ?Family History  ?Problem Relation Age of Onset  ? Hypertension Mother   ? Hearing loss Mother   ? Depression Mother   ? Cancer Mother   ? Arthritis Mother   ? Alcohol abuse Mother   ? Mental illness Mother   ? Mental illness Father   ? Hearing loss Father   ? Depression Father   ? Arthritis Father   ? Alcohol abuse Father   ? Hyperlipidemia Brother   ? Depression Brother   ? Alcohol abuse Brother   ? Mental illness Brother   ? Depression Brother   ? Cancer Maternal Grandmother   ? COPD Paternal Grandfather   ? ? ?Social History: ?Social History   ? ?Socioeconomic History  ? Marital status: Married  ?  Spouse name: Not on file  ? Number of children: Not on file  ? Years of education: Not on file  ? Highest education level: Not on file  ?Occupational History  ? Not on file  ?Tobacco Use  ? Smoking status: Never  ? Smokeless tobacco: Never  ?Substance and Sexual Activity  ? Alcohol use: Never  ? Drug use: Never  ? Sexual activity: Yes  ?Other Topics Concern  ? Not on file  ?Social History Narrative  ? Not on file  ? ?Social Determinants of Health  ? ?Financial Resource Strain: Not on file  ?Food Insecurity: Not on file  ?Transportation Needs: Not on file  ?Physical Activity: Not on file  ?Stress: Not on file  ?Social Connections: Not on file  ? ? ?Allergies: ?Allergies  ?Allergen Reactions  ? Sulfa Antibiotics Rash  ? Valacyclovir   ?  Other reaction(s): Dizziness (intolerance)  ? ? ?Outpatient Meds: ?Current Outpatient Medications  ?Medication Sig Dispense Refill  ? B Complex-C (B-COMPLEX WITH VITAMIN C) tablet Take by mouth.    ? Cholecalciferol 25 MCG (1000 UT) tablet Take by mouth.    ? clonazePAM (KLONOPIN) 0.5 MG tablet Take 0.5 mg by mouth daily  as needed.    ? FLUoxetine (PROZAC) 20 MG capsule Take 1 capsule (20 mg total) by mouth daily. 90 capsule 3  ? FLUoxetine (PROZAC) 40 MG capsule Take 1 capsule (40 mg total) by mouth daily. 90 capsule 3  ? hydrOXYzine (VISTARIL) 25 MG capsule Take 25 mg by mouth at bedtime.    ? lamoTRIgine (LAMICTAL) 150 MG tablet Take 1 tablet (150 mg total) by mouth 2 (two) times daily. 180 tablet 1  ? lithium carbonate (ESKALITH) 450 MG CR tablet Take 1 tablet (450 mg total) by mouth daily as needed. 90 tablet 1  ? omeprazole (PRILOSEC) 20 MG capsule Take 1 capsule (20 mg total) by mouth daily. 90 capsule 1  ? ?No current facility-administered medications for this visit.  ? ? ? ? ?___________________________________________________________________ ?Objective  ? ?Exam: ? ?BP 108/66   Pulse 65   Ht 5\' 5"  (1.651 m)   Wt 134  lb (60.8 kg)   BMI 22.30 kg/m?  ?Wt Readings from Last 3 Encounters:  ?02/23/22 134 lb (60.8 kg)  ?12/22/21 133 lb 12.8 oz (60.7 kg)  ?11/18/21 135 lb (61.2 kg)  ? ? ?General: Well-appearing ?Eyes: sclera anicteric, no redness ?ENT: oral mucosa moist without lesions, no cervical or supraclavicular lymphadenopathy ?CV: RRR without murmur, S1/S2, no JVD, no peripheral edema ?Resp: clear to auscultation bilaterally, normal RR and effort noted ?GI: soft, no tenderness, with active bowel sounds. No guarding or palpable organomegaly noted. ?Skin; warm and dry, no rash or jaundice noted ?Neuro: awake, alert and oriented x 3. Normal gross motor function and fluent speech ? ?Data: ? ?A scan of the partial MRI report is in the results section of her chart. ?The patient brought a copy of the entire report printed from that practices portal with the following: ? ?Indication was "enlarging hepatic cyst, increased nausea, no pain" ?Comparison to abdominal ultrasound 07/03/2020 and CT abdomen and pelvis 06/25/2013 ? ?Numerous hepatic cysts are present.  Largest in the right hepatic lobe measures 67 x 58 x 55 mm.  Largest in the left hepatic lobe 23 x 25 x 21 mm.  No restricted diffusion.  No biliary ductal dilatation.  No stones are appreciated.  No enhancing lesions.  The spleen, pancreas, adrenals demonstrate normal contour and attention.  Multiple tiny renal cysts are present bilaterally.  No solid masses. ? ?(No comment is made on appearance of cyst compared to the prior CT scan - HD) ? ?Assessment: ?Encounter Diagnosis  ?Name Primary?  ? Hepatic cyst Yes  ? ? ?Asymptomatic bilateral hepatic cysts, benign by description and discovered incidentally on imaging. ? ? ?Plan: ? ?Baseline hepatic function panel today. ? ?MRI liver with and without contrast for cyst surveillance.  If stable and still benign-appearing, no further radiographic surveillance is likely necessary. ? ?She will try to get the previous colonoscopy report so we  can set an appropriate screening colonoscopy recall.  If she cannot do so, she will let me know that as well so we can still set a recall based on approximate timing of last colonoscopy. ? ?Thank you for the courtesy of this consult.  Please call me with any questions or concerns. ? ?Nelida Meuse III ? ?CC: Referring provider noted above ? ?

## 2022-02-23 NOTE — Progress Notes (Deleted)
? ? ? ?  [facility] Progress Note ? ?Chief Complaint: *** ? ?Subjective  ?History: ? ?*** ? ?ROS: ?Cardiovascular:  no chest pain ?Respiratory: no dyspnea ? ?The patient's Past Medical, Family and Social History were reviewed and are on file in the EMR. ? ?Objective: ? ?Med list reviewed ? ?Current Outpatient Medications:  ?  B Complex-C (B-COMPLEX WITH VITAMIN C) tablet, Take by mouth., Disp: , Rfl:  ?  Cholecalciferol 25 MCG (1000 UT) tablet, Take by mouth., Disp: , Rfl:  ?  clonazePAM (KLONOPIN) 0.5 MG tablet, Take 0.5 mg by mouth daily as needed., Disp: , Rfl:  ?  FLUoxetine (PROZAC) 20 MG capsule, Take 1 capsule (20 mg total) by mouth daily., Disp: 90 capsule, Rfl: 3 ?  FLUoxetine (PROZAC) 40 MG capsule, Take 1 capsule (40 mg total) by mouth daily., Disp: 90 capsule, Rfl: 3 ?  hydrOXYzine (VISTARIL) 25 MG capsule, Take 25 mg by mouth at bedtime., Disp: , Rfl:  ?  lamoTRIgine (LAMICTAL) 150 MG tablet, Take 1 tablet (150 mg total) by mouth 2 (two) times daily., Disp: 180 tablet, Rfl: 1 ?  lithium carbonate (ESKALITH) 450 MG CR tablet, Take 1 tablet (450 mg total) by mouth daily as needed., Disp: 90 tablet, Rfl: 1 ?  omeprazole (PRILOSEC) 20 MG capsule, Take 1 capsule (20 mg total) by mouth daily., Disp: 90 capsule, Rfl: 1 ? ? ?Vital signs in last 24 hrs: ?There were no vitals filed for this visit. ?Wt Readings from Last 3 Encounters:  ?02/23/22 134 lb (60.8 kg)  ?12/22/21 133 lb 12.8 oz (60.7 kg)  ?11/18/21 135 lb (61.2 kg)  ?  ?Physical Exam ? ?*** ?HEENT: sclera anicteric, oral mucosa moist without lesions ?Neck: supple, no thyromegaly, JVD or lymphadenopathy ?Cardiac: RRR without murmurs, S1S2 heard, no peripheral edema ?Pulm: clear to auscultation bilaterally, normal RR and effort noted ?Abdomen: soft, *** tenderness, with active bowel sounds. No guarding or palpable hepatosplenomegaly. ?Skin; warm and dry, no jaundice or rash ? ?Labs: ? ? ?  Latest Ref Rng & Units 12/17/2021  ?  8:36 AM 09/02/2021  ?  7:44  AM  ?BMP  ?Glucose 65 - 99 mg/dL 94   93    ?BUN 7 - 25 mg/dL 24   24    ?Creatinine 0.50 - 1.05 mg/dL 1.10   1.17    ?BUN/Creat Ratio 6 - 22 (calc) 22   21    ?Sodium 135 - 146 mmol/L 145   145    ?Potassium 3.5 - 5.3 mmol/L 4.7   4.5    ?Chloride 98 - 110 mmol/L 109   107    ?CO2 20 - 32 mmol/L 28   26    ?Calcium 8.6 - 10.4 mg/dL 10.6   10.7    ? ?TSH nml ?___________________________________________ ?Radiologic studies: ? ? ?____________________________________________ ?Other: ? ? ?_____________________________________________ ?Assessment & Plan  ?Assessment: ?No diagnosis found. ? ? ? ?Plan: ? ? ?*** minutes were spent on this encounter (including chart review, history/exam, counseling/coordination of care, and documentation) > 50% of that time was spent on counseling and coordination of care.  ? ?Vicki Newman ? ?

## 2022-03-03 ENCOUNTER — Ambulatory Visit (HOSPITAL_COMMUNITY): Payer: BC Managed Care – PPO

## 2022-03-03 ENCOUNTER — Encounter (HOSPITAL_COMMUNITY): Payer: Self-pay

## 2022-03-10 ENCOUNTER — Telehealth: Payer: Self-pay | Admitting: Family

## 2022-03-10 NOTE — Telephone Encounter (Signed)
Pt is requesting to transfer care from [facility] to [facility], she said she would like to just try another provider in the [facility] system. Is it ok to transfer? ?

## 2022-03-11 NOTE — Telephone Encounter (Signed)
TOC appointment made for 5/15 ?

## 2022-03-15 ENCOUNTER — Encounter: Payer: Self-pay | Admitting: Family

## 2022-03-15 ENCOUNTER — Ambulatory Visit (INDEPENDENT_AMBULATORY_CARE_PROVIDER_SITE_OTHER)
Admission: RE | Admit: 2022-03-15 | Discharge: 2022-03-15 | Disposition: A | Discharge status: Home / Self Care | Payer: BC Managed Care – PPO | Source: Ambulatory Visit | Attending: Family | Admitting: Family

## 2022-03-15 ENCOUNTER — Ambulatory Visit (INDEPENDENT_AMBULATORY_CARE_PROVIDER_SITE_OTHER): Payer: BC Managed Care – PPO | Admitting: Family

## 2022-03-15 VITALS — BP 106/64 | HR 67 | Temp 98.0°F | Resp 16 | Ht 65.0 in | Wt 133.6 lb

## 2022-03-15 DIAGNOSIS — M4003 Postural kyphosis, cervicothoracic region: Secondary | ICD-10-CM

## 2022-03-15 DIAGNOSIS — E559 Vitamin D deficiency, unspecified: Secondary | ICD-10-CM | POA: Diagnosis not present

## 2022-03-15 DIAGNOSIS — M62838 Other muscle spasm: Secondary | ICD-10-CM | POA: Insufficient documentation

## 2022-03-15 DIAGNOSIS — F319 Bipolar disorder, unspecified: Secondary | ICD-10-CM | POA: Insufficient documentation

## 2022-03-15 DIAGNOSIS — F3181 Bipolar II disorder: Secondary | ICD-10-CM | POA: Diagnosis not present

## 2022-03-15 DIAGNOSIS — R7989 Other specified abnormal findings of blood chemistry: Secondary | ICD-10-CM | POA: Insufficient documentation

## 2022-03-15 DIAGNOSIS — H6123 Impacted cerumen, bilateral: Secondary | ICD-10-CM

## 2022-03-15 DIAGNOSIS — M542 Cervicalgia: Secondary | ICD-10-CM | POA: Insufficient documentation

## 2022-03-15 DIAGNOSIS — R42 Dizziness and giddiness: Secondary | ICD-10-CM | POA: Diagnosis not present

## 2022-03-15 DIAGNOSIS — Z9189 Other specified personal risk factors, not elsewhere classified: Secondary | ICD-10-CM | POA: Insufficient documentation

## 2022-03-15 DIAGNOSIS — K219 Gastro-esophageal reflux disease without esophagitis: Secondary | ICD-10-CM

## 2022-03-15 LAB — CBC WITH DIFFERENTIAL/PLATELET
Basophils Absolute: 0.1 10*3/uL (ref 0.0–0.1)
Basophils Relative: 1.1 % (ref 0.0–3.0)
Eosinophils Absolute: 0.2 10*3/uL (ref 0.0–0.7)
Eosinophils Relative: 3.3 % (ref 0.0–5.0)
HCT: 43.5 % (ref 36.0–46.0)
Hemoglobin: 14.3 g/dL (ref 12.0–15.0)
Lymphocytes Relative: 20.2 % (ref 12.0–46.0)
Lymphs Abs: 1.3 10*3/uL (ref 0.7–4.0)
MCHC: 32.9 g/dL (ref 30.0–36.0)
MCV: 93.8 fl (ref 78.0–100.0)
Monocytes Absolute: 0.5 10*3/uL (ref 0.1–1.0)
Monocytes Relative: 7.6 % (ref 3.0–12.0)
Neutro Abs: 4.4 10*3/uL (ref 1.4–7.7)
Neutrophils Relative %: 67.8 % (ref 43.0–77.0)
Platelets: 366 10*3/uL (ref 150.0–400.0)
RBC: 4.63 Mil/uL (ref 3.87–5.11)
RDW: 12.9 % (ref 11.5–15.5)
WBC: 6.4 10*3/uL (ref 4.0–10.5)

## 2022-03-15 LAB — VITAMIN D 25 HYDROXY (VIT D DEFICIENCY, FRACTURES): VITD: 79.25 ng/mL (ref 30.00–100.00)

## 2022-03-15 LAB — COMPREHENSIVE METABOLIC PANEL
ALT: 20 U/L (ref 0–35)
AST: 17 U/L (ref 0–37)
Albumin: 4.6 g/dL (ref 3.5–5.2)
Alkaline Phosphatase: 96 U/L (ref 39–117)
BUN: 23 mg/dL (ref 6–23)
CO2: 27 mEq/L (ref 19–32)
Calcium: 10.5 mg/dL (ref 8.4–10.5)
Chloride: 107 mEq/L (ref 96–112)
Creatinine, Ser: 1.07 mg/dL (ref 0.40–1.20)
GFR: 56.53 mL/min — ABNORMAL LOW (ref >60.00)
Glucose, Bld: 84 mg/dL (ref 70–99)
Potassium: 4.6 mEq/L (ref 3.5–5.1)
Sodium: 142 mEq/L (ref 135–145)
Total Bilirubin: 0.4 mg/dL (ref 0.2–1.2)
Total Protein: 7.1 g/dL (ref 6.0–8.3)

## 2022-03-15 LAB — B12 AND FOLATE PANEL
Folate: >23.5 ng/mL (ref >5.9)
Vitamin B-12: 922 pg/mL — ABNORMAL HIGH (ref 211–911)

## 2022-03-15 MED ORDER — LITHIUM CARBONATE ER 450 MG PO TBCR: 450.0000 mg | EXTENDED_RELEASE_TABLET | Freq: Every evening | ORAL | 1 refills | Status: DC

## 2022-03-15 NOTE — Patient Instructions (Addendum)
A referral was placed today for Ent as well as pharmacy. ?Please let us know if you have not heard back within 2 weeks about the referral. ? ?A referral was placed today for physical therapy for your neck ?Please let us know if you have not heard back within 2 weeks about the referral. ? ?Recommend daily debrox x one week, f/u one week we can irrigate ears. ? ?Complete xray(s) prior to leaving today. I will notify you of your results once received. ? ?Welcome to our clinic, I am happy to have you as my new patient. I am excited to continue on this healthcare journey with you. ? ?Stop by the lab prior to leaving today. I will notify you of your results once received.  ? ?Please keep in mind ?Any my chart messages you send have p to a three business day turnaround for a response.  ?Phone calls may have up to a one day business turnaround for a  response.  ? ?If you need a medication refill I recommend you request it through the pharmacy as this is easiest for [place] rather than sending a message and or phone call.  ? ?Due to recent changes in healthcare laws, you may see results of your imaging and/or laboratory studies on MyChart before I have had a chance to review them.  I understand that in some cases there may be results that are confusing or concerning to you. Please understand that not all results are received at the same time and often I may need to interpret multiple results in order to provide you with the best plan of care or course of treatment. Therefore, I ask that you please give me 2 business days to thoroughly review all your results before contacting my office for clarification. Should we see a critical lab result, you will be contacted sooner.  ? ?It was a pleasure seeing you today! Please do not hesitate to reach out with any questions and or concerns. ? ?Regards,  ? ?Elsworth Ledin ?FNP-C ? ? ?

## 2022-03-15 NOTE — Progress Notes (Signed)
Kidney function slightly lower than normal, make sure drinking a good amount of water to avoid dehydration. Will repeat again in three months. Any urinary symptoms such as urinary frequency, urgency or dysuria?  ? ?B12 on higher end of normal, what are you taking daily for b12? Should start taking every other day instead of daily if taking.  ? ?Vitamin D on higher end also, what dosage vitamin D are you taking? Should decrease this dosage too, I believe you said 7000 IU ? Should be taking 2000 once daily. Excessive Vitamin D can also have side effects.  ? ?No anemia or white blood cell concerns.

## 2022-03-15 NOTE — Progress Notes (Signed)
? ?Established Patient Office Visit ? ?Subjective:  ?Patient ID: Vicki Newman, female    DOB: 10/07/1961  Age: 61 y.o. MRN: [id number] ? ?CC:  ?Chief Complaint  ?Patient presents with  ? Transitions Of Care  ? ? ?HPI ?Vicki Newman is here for a transition of care visit. ? ?Prior provider was: Dulce Sellarstephanie Hudnell, NP  ?Pt is without acute concerns.  ? ?[facility], Loura Haltbarbara Goodman, NP psych.  ? ?Pap: 2021, regular. Repeat in five years.  ? ?H/o falling over the last two years, with concussive history. She states since the fall she has unstable gait, increased falls and worse with being stressed or tired. She has the sensation of being off balance with dysequilibrium, no c/o vertigo, spinning sensation or feeling lightheaded. She tries to catch herself and remains in place until feeling goes away.  ?[place] the [place], had neuro workup per pt was negative. Was told to f/u with neurology in [place] as had been moving. Had neuro workup 09/11/21, Dr. Sherlean FootJames Crisp and he recommended next step would be Ent. ?Neurologist recommended DDX as possible episodic ataxia, vestibular migraine, TIA/CVA however with no personal h/o so neuro recommends ENT referral.  ?Did have balance therapy with some mild improvement.  ?Neuro did state with medications concern for serotonin syndrome, however very unlikely per doctors in [place]. Some medications were stopped or reduced per and neuro report with no improvement. Had cardiac work up with TTE EKG, unremarkable.  ? ?chronic concerns: ? ?Mild elevation calcium last visit, 10.6 . Pt does have h/o vitamin d def, however also taking vit D3 7000 iu once daily.  ? ?Past Medical History:  ?Diagnosis Date  ? Allergy   ? Anxiety   ? Bipolar 1 disorder (HCC)   ? with mania  ? Bone spur   ? shoulder  ? Depression   ? Falls   ? ? ?Past Surgical History:  ?Procedure Laterality Date  ? HERNIA REPAIR  2000  ? OVARIAN CYST REMOVAL Bilateral   ? bil   ? ? ?Family History  ?Problem Relation Age of Onset  ? Hypertension Mother   ? Hearing loss Mother   ? Depression Mother   ? Cancer Mother   ? Arthritis Mother   ? Alcohol abuse Mother   ? Mental illness Mother   ? Mental illness Father   ? Hearing loss Father   ? Depression Father   ? Arthritis Father   ? Alcohol abuse Father   ? Hyperlipidemia Brother   ? Depression Brother   ? Alcohol abuse Brother   ? Mental illness Brother   ? Depression Brother   ? Cancer Maternal Grandmother   ? COPD Paternal Grandfather   ? ? ?Social History  ? ?Socioeconomic History  ? Marital status: Married  ?  Spouse name: Not on file  ? Number of children: 3  ? Years of education: Not on file  ? Highest education level: Not on file  ?Occupational History  ? Occupation: retired  ?Tobacco Use  ? Smoking status: Never  ? Smokeless tobacco: Never  ?Vaping Use  ? Vaping Use: Never used  ?Substance and Sexual Activity  ? Alcohol use: Never  ? Drug use: Never  ? Sexual activity: Yes  ?  Partners: Male  ?  Birth control/protection: Post-menopausal  ?Other Topics Concern  ? Not on file  ?Social History Narrative  ? Three girls  ? 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 ?Social Determinants  of Health  ? ?Financial Resource Strain: Not on file  ?Food Insecurity: Not on file  ?Transportation Needs: Not on file  ?Physical Activity: Not on file  ?Stress: Not on file  ?Social Connections: Not on file  ?Intimate Partner Violence: Not on file  ? ? ?Outpatient Medications Prior to Visit  ?Medication Sig Dispense Refill  ? B Complex-C (B-COMPLEX WITH VITAMIN C) tablet Take by mouth.    ? busPIRone (BUSPAR) 15 MG tablet Take 15 mg by mouth 2 (two) times daily.    ? Cholecalciferol 25 MCG (1000 UT) tablet Take by mouth.    ? FLUoxetine (PROZAC) 20 MG capsule Take 1 capsule (20 mg total) by mouth daily. 90 capsule 3  ? FLUoxetine (PROZAC) 40 MG capsule Take 1 capsule (40 mg total) by mouth daily. 90 capsule 3  ? hydrOXYzine (VISTARIL) 25 MG capsule Take 25 mg by mouth at  bedtime.    ? lamoTRIgine (LAMICTAL) 150 MG tablet Take 1 tablet (150 mg total) by mouth 2 (two) times daily. 180 tablet 1  ? omeprazole (PRILOSEC) 20 MG capsule Take 1 capsule (20 mg total) by mouth daily. 90 capsule 1  ? lithium carbonate (ESKALITH) 450 MG CR tablet Take 1 tablet (450 mg total) by mouth daily as needed. 90 tablet 1  ? clonazePAM (KLONOPIN) 0.5 MG tablet Take 0.5 mg by mouth daily as needed.    ? ?No facility-administered medications prior to visit.  ? ? ?Allergies  ?Allergen Reactions  ? Sulfa Antibiotics Rash  ? Valacyclovir   ?  Other reaction(s): Dizziness (intolerance)  ? ? ?ROS ?Review of Systems  ?Constitutional:  Negative for chills and fatigue.  ?HENT:  Negative for ear pain, sinus pressure and sinus pain.   ?Respiratory:  Negative for cough and shortness of breath.   ?Cardiovascular:  Negative for chest pain and leg swelling.  ?Gastrointestinal:  Negative for diarrhea and nausea.  ?Genitourinary:  Negative for difficulty urinating, menstrual problem and vaginal bleeding.  ?Neurological:  Positive for dizziness (feels off balance at times) and light-headedness (at times not at current). Negative for headaches.  ?     Increased falls due to dysequilibrium  ?Psychiatric/Behavioral:  Negative for agitation and sleep disturbance.   ?All other systems reviewed and are negative. ? ? ? ?  ?Objective:  ?  ?Physical Exam ?Vitals reviewed.  ?Constitutional:   ?   General: She is not in acute distress. ?   Appearance: Normal appearance. She is normal weight. She is not ill-appearing, toxic-appearing or diaphoretic.  ?HENT:  ?   Right Ear: Tympanic membrane normal. There is impacted cerumen.  ?   Left Ear: Tympanic membrane normal. There is impacted cerumen.  ?   Mouth/Throat:  ?   Mouth: Mucous membranes are moist.  ?   Pharynx: No pharyngeal swelling.  ?   Tonsils: No tonsillar exudate.  ?Eyes:  ?   Extraocular Movements: Extraocular movements intact.  ?   Conjunctiva/sclera: Conjunctivae normal.  ?    Pupils: Pupils are equal, round, and reactive to light.  ?Neck:  ?   Thyroid: No thyroid mass.  ?Cardiovascular:  ?   Rate and Rhythm: Normal rate and regular rhythm.  ?Pulmonary:  ?   Effort: Pulmonary effort is normal.  ?   Breath sounds: Normal breath sounds.  ?Abdominal:  ?   General: Abdomen is flat. Bowel sounds are normal.  ?   Palpations: Abdomen is soft.  ?Musculoskeletal:     ?   General:  Normal range of motion.  ?Lymphadenopathy:  ?   Cervical:  ?   Right cervical: No superficial cervical adenopathy. ?   Left cervical: No superficial cervical adenopathy.  ?Skin: ?   General: Skin is warm.  ?   Capillary Refill: Capillary refill takes less than 2 seconds.  ?Neurological:  ?   General: No focal deficit present.  ?   Mental Status: She is alert and oriented to person, place, and time.  ?   Cranial Nerves: Cranial nerves 2-12 are intact. No cranial nerve deficit or facial asymmetry.  ?   Motor: Motor function is intact. No weakness.  ?   Coordination: Coordination is intact. Romberg sign negative.  ?   Gait: Gait is intact.  ?Psychiatric:     ?   Mood and Affect: Mood normal.     ?   Behavior: Behavior normal.     ?   Thought Content: Thought content normal.     ?   Judgment: Judgment normal.  ? ? ? ? ?BP 106/64   Pulse 67   Temp 98 ?F (36.7 ?C)   Resp 16   Ht 5\' 5"  (1.651 m)   Wt 133 lb 9 oz (60.6 kg)   SpO2 97%   BMI 22.23 kg/m?  ?Wt Readings from Last 3 Encounters:  ?03/15/22 133 lb 9 oz (60.6 kg)  ?02/23/22 134 lb (60.8 kg)  ?12/22/21 133 lb 12.8 oz (60.7 kg)  ? ? ? ?Health Maintenance Due  ?Topic Date Due  ? PAP SMEAR-Modifier  Never done  ? COVID-19 Vaccine (3 - Moderna risk series) 03/07/2020  ? ? ?There are no preventive care reminders to display for this patient. ? ?Lab Results  ?Component Value Date  ? TSH 2.31 12/17/2021  ? ?Lab Results  ?Component Value Date  ? WBC 6.4 03/15/2022  ? HGB 14.3 03/15/2022  ? HCT 43.5 03/15/2022  ? MCV 93.8 03/15/2022  ? PLT 366.0 03/15/2022  ? ?Lab Results   ?Component Value Date  ? NA 142 03/15/2022  ? K 4.6 03/15/2022  ? CO2 27 03/15/2022  ? GLUCOSE 84 03/15/2022  ? BUN 23 03/15/2022  ? CREATININE 1.07 03/15/2022  ? BILITOT 0.4 03/15/2022  ? ALKPHOS 96 03/15/2022  ?

## 2022-03-16 DIAGNOSIS — H6123 Impacted cerumen, bilateral: Secondary | ICD-10-CM | POA: Insufficient documentation

## 2022-03-16 DIAGNOSIS — M4003 Postural kyphosis, cervicothoracic region: Secondary | ICD-10-CM | POA: Insufficient documentation

## 2022-03-16 LAB — PTH, INTACT AND CALCIUM
Calcium: 10.6 mg/dL — ABNORMAL HIGH (ref 8.6–10.4)
PTH: 25 pg/mL (ref 16–77)

## 2022-03-16 NOTE — Assessment & Plan Note (Signed)
Workup in place ?Referring to ENT ?Will irrigate ears next week as well ?

## 2022-03-16 NOTE — Assessment & Plan Note (Signed)
continue omeprazole 20 mg ?Try to decrease and or avoid spicy foods, fried fatty foods, and also caffeine and chocolate as these can increase heartburn symptoms.  ? ?

## 2022-03-16 NOTE — Assessment & Plan Note (Signed)
Pt to continue f/u with psychiatry as scheduled ?Continue medications as prescribed. ?

## 2022-03-16 NOTE — Assessment & Plan Note (Signed)
Repeat cmp pending results 

## 2022-03-16 NOTE — Assessment & Plan Note (Addendum)
Debrox x one week ?Irrigate next week, can contribute to dizziness/unsteady gait ?

## 2022-03-16 NOTE — Assessment & Plan Note (Signed)
Xray cervical spine, pending results.  ? ?

## 2022-03-16 NOTE — Assessment & Plan Note (Signed)
Ordered vitamin d, pending results ?

## 2022-03-16 NOTE — Progress Notes (Signed)
Spurring in the spine as well as facet hypertrophy aka pinched nerves which is likely contributing to your pain. Have you seen an orthospine or neurosurgeon in the past? I do suggest we refer you to one

## 2022-03-16 NOTE — Assessment & Plan Note (Signed)
With dizziness/lightheadedness referring to pharmacy for possible polypharmacy. ?Also pt to continue f/u with psychiatrist for work on decreasing medications as able ?

## 2022-03-16 NOTE — Assessment & Plan Note (Signed)
Likely contributing to muscle spasm/tightness ?Work on posture ?Refer to physical therapy for eval/treat ?

## 2022-03-16 NOTE — Assessment & Plan Note (Signed)
bil impacted cerumen ?Neuro and cardio workup acceptable ?Refer to ENT for further eval/treat ?

## 2022-03-16 NOTE — Assessment & Plan Note (Signed)
Order pth and vitamin D . Pending results ?

## 2022-03-16 NOTE — Assessment & Plan Note (Signed)
Muscle relaxer recommended, however hesistant with polypharmacy may contribute to more fatigue and increased falls. ?Lidocaine patches, tylenol prn, warm compresses.  ?

## 2022-03-17 ENCOUNTER — Telehealth: Payer: Self-pay | Admitting: Family

## 2022-03-17 ENCOUNTER — Telehealth: Payer: Self-pay

## 2022-03-17 NOTE — Progress Notes (Signed)
? ? ?  Chronic Care Management ?Pharmacy Assistant  ? ?Name: Vicki Newman  MRN: [id number] DOB: [birth year] ? ?Reason for Encounter: Non-CCM Animator) ?  ?Medications: ?Outpatient Encounter Medications as of 03/17/2022  ?Medication Sig Note  ? B Complex-C (B-COMPLEX WITH VITAMIN C) tablet Take by mouth.   ? busPIRone (BUSPAR) 15 MG tablet Take 15 mg by mouth 2 (two) times daily.   ? Cholecalciferol 25 MCG (1000 UT) tablet Take by mouth. 11/18/2021: Pt reports 7000IU  ? FLUoxetine (PROZAC) 20 MG capsule Take 1 capsule (20 mg total) by mouth daily.   ? FLUoxetine (PROZAC) 40 MG capsule Take 1 capsule (40 mg total) by mouth daily.   ? hydrOXYzine (VISTARIL) 25 MG capsule Take 25 mg by mouth at bedtime.   ? lamoTRIgine (LAMICTAL) 150 MG tablet Take 1 tablet (150 mg total) by mouth 2 (two) times daily.   ? lithium carbonate (ESKALITH) 450 MG CR tablet Take 1 tablet (450 mg total) by mouth at bedtime.   ? omeprazole (PRILOSEC) 20 MG capsule Take 1 capsule (20 mg total) by mouth daily. 12/22/2021: Needs refill.  ? ?No facility-administered encounter medications on file as of 03/17/2022.  ? ?Vicki Newman was contacted to remind of upcoming telephone visit with Al Corpus on 03/22/22 at 9:30. Patient was reminded to have any blood glucose and blood pressure readings available for review at appointment.  ? ?Message was left reminding patient of appointment. ? ?CCM referral has been placed prior to visit?  No  - Patient is Non-CCM ? ?Star Rating Drugs: ?Medication:  Last Fill: Day Supply ?No star rating drugs ? ?Al Corpus, CPP notified ? ?Claudina Lick, RMA ?Clinical Pharmacy Assistant ?[phone] ? ? ? ? ?

## 2022-03-17 NOTE — Telephone Encounter (Signed)
Reason for Referral Request: Neck ? ?Has patient been seen PCP for this complaint? ?Yes ? ?Patient scheduled on: N/A ? ?Referral for which specialty: Physical Therapy ? ?Preferred office/provider: [facility]  ?[address], [place] [address] ?Phone number: [phone] ?  ?

## 2022-03-19 NOTE — Telephone Encounter (Signed)
Letter sent to pt

## 2022-03-22 ENCOUNTER — Ambulatory Visit: Payer: BC Managed Care – PPO | Admitting: Pharmacist

## 2022-03-22 DIAGNOSIS — F319 Bipolar disorder, unspecified: Secondary | ICD-10-CM

## 2022-03-22 DIAGNOSIS — K219 Gastro-esophageal reflux disease without esophagitis: Secondary | ICD-10-CM

## 2022-03-22 NOTE — Progress Notes (Signed)
Care Management Pharmacy Note  03/23/2022 Name:  Vicki Newman MRN:  [id number] DOB:  [birth year]  Summary: Non-CCM visit -Pt seen for concerns of polypharmacy - dizziness, multiple falls and at least 2 concussions in past 3 years -Reviewed medications at length - it is possible that any/all of her psychoactive meds are contributing to dizziness, pt is in tough spot because mood is reportedly best controlled on current regimen than any other regimen she's tried, tapering/changing medications may result in loss of bipolar control - that being said, recent additions buspirone and hydroxyzine are most likely to cause dizziness, would recommend starting with buspar dose reduction as pt reports she has not noticed much benefit with this anyway.  Recommendations/Changes made from today's visit: -Recommend reducing buspirone by 1/2 for 1-2 weeks; can likely d/c altogether in a few weeks. Pt to consult with psychiatric provider Vicki Newman before making changes.  Plan: -Pharmacist follow up televisit scheduled for 5 weeks    Subjective: Vicki Newman is an 61 y.o. year old female who is a primary patient of Vicki Newman, [place].  The Pharmacy team was consulted for assistance with disease and medication management  Engaged with patient by telephone for initial visit in response to provider referral for pharmacy case management and/or care coordination services.    Patient Care Team: Vicki Pancoast, FNP as PCP - General (Family Medicine)  Pt reports ~3 year history of dizziness/falls. Oct 2020 - first fall w/ concussion - due to feeling tired. She has fallen often ever since, sometimes every couple months. Sometimes when tired or anxious. [place] and hit her head on dresser a few months ago.   Recent office visits: 03/15/22 NP Vicki Newman OV: TOC. Hx of fall w/ concussion, unstable gait since then. Per pt neuro workup in [place] was negative. [facility] DDX episodic ataxia, vestibular  migraine, TIA - advised f/u with ENT. Referred to PT for neck. Referred to ENT. Labs - High B12 and D  Recent consult visits: 02/23/22 Dr Vicki Newman (GI): hepatic cyst - LFTs normal.  01/07/22 NP Vicki Newman (Psych): Bipolar I, anxiety. D/C temazepam. Increase buspirone to 10 mg TID (from BID). Increase fluoxetine from 50 to 60 mg daily. Decrease mirtazapine from 15 to 7.5 mg. Li level 0.5.  12/22/21 NP Vicki Newman (PCP): fell 1 week, hit head. Refilled meclizine. Ordered CT head.  Hospital visits: None in previous 6 months   Objective:  Lab Results  Component Value Date   CREATININE 1.07 03/15/2022   BUN 23 03/15/2022   GFR 56.53 (L) 03/15/2022   EGFR 54 (L) 09/02/2021   NA 142 03/15/2022   K 4.6 03/15/2022   CALCIUM 10.5 03/15/2022   CALCIUM 10.6 (H) 03/15/2022   CO2 27 03/15/2022   GLUCOSE 84 03/15/2022    Lab Results  Component Value Date/Time   GFR 56.53 (L) 03/15/2022 11:59 AM    Last diabetic Eye exam: No results found for: HMDIABEYEEXA  Last diabetic Foot exam: No results found for: HMDIABFOOTEX   No results found for: CHOL, HDL, LDLCALC, LDLDIRECT, TRIG, CHOLHDL     Latest Ref Rng & Units 03/15/2022   11:59 AM 02/23/2022   11:04 AM  Hepatic Function  Total Protein 6.0 - 8.3 g/dL 7.1   7.3    Albumin 3.5 - 5.2 g/dL 4.6   4.7    AST 0 - 37 U/L 17   13    ALT 0 - 35 U/L 20   16    Alk Phosphatase  39 - 117 U/L 96   104    Total Bilirubin 0.2 - 1.2 mg/dL 0.4   0.5    Bilirubin, Direct 0.0 - 0.3 mg/dL  0.1      Lab Results  Component Value Date/Time   TSH 2.31 12/17/2021 08:36 AM   TSH 2.230 09/02/2021 07:44 AM       Latest Ref Rng & Units 03/15/2022   11:59 AM  CBC  WBC 4.0 - 10.5 K/uL 6.4    Hemoglobin 12.0 - 15.0 g/dL 14.3    Hematocrit 36.0 - 46.0 % 43.5    Platelets 150.0 - 400.0 K/uL 366.0      Lab Results  Component Value Date/Time   VD25OH 79.25 03/15/2022 11:59 AM   Lab Results  Component Value Date/Time   VITAMINB12 922 (H)  03/15/2022 11:59 AM    Clinical ASCVD: No  The ASCVD Risk score (Arnett DK, et al., 2019) failed to calculate for the following reasons:   Cannot find a previous HDL lab   Cannot find a previous total cholesterol lab       03/15/2022   11:17 AM 11/18/2021    2:31 PM  Depression screen PHQ 2/9  Decreased Interest 1 2  Down, Depressed, Hopeless 1 2  PHQ - 2 Score 2 4  Altered sleeping 0 3  Tired, decreased energy 1 3  Change in appetite 0 3  Feeling bad or failure about yourself  0 0  Trouble concentrating 0 2  Moving slowly or fidgety/restless 0 0  Suicidal thoughts 0 0  PHQ-9 Score 3 15  Difficult doing work/chores Not difficult at all Somewhat difficult     Social History   Tobacco Use  Smoking Status Never  Smokeless Tobacco Never   BP Readings from Last 3 Encounters:  03/15/22 106/64  02/23/22 108/66  12/22/21 111/75   Pulse Readings from Last 3 Encounters:  03/15/22 67  02/23/22 65  12/22/21 74   Wt Readings from Last 3 Encounters:  03/15/22 133 lb 9 oz (60.6 kg)  02/23/22 134 lb (60.8 kg)  12/22/21 133 lb 12.8 oz (60.7 kg)   BMI Readings from Last 3 Encounters:  03/15/22 22.23 kg/m  02/23/22 22.30 kg/m  12/22/21 22.27 kg/m    Assessment/Interventions: Review of patient past medical history, allergies, medications, health status, including review of consultants reports, laboratory and other test data, was performed as part of comprehensive evaluation and provision of chronic care management services.   SDOH Screenings   Alcohol Screen: Not on file  Depression (PHQ2-9): Low Risk    PHQ-2 Score: 3  Financial Resource Strain: Not on file  Food Insecurity: Not on file  Housing: Not on file  Physical Activity: Not on file  Social Connections: Not on file  Stress: Not on file  Tobacco Use: Low Risk    Smoking Tobacco Use: Never   Smokeless Tobacco Use: Never   Passive Exposure: Not on file  Transportation Needs: Not on file    [place]  Allergies  Allergen Reactions   Sulfa Antibiotics Rash   Valacyclovir     Other reaction(s): Dizziness (intolerance)    Medications Reviewed Today     Reviewed by Vicki Newman, [facility] (Pharmacist) on 03/23/22 at [place] List Status: <None>   Medication Order Taking? Sig Documenting Provider Last Dose Status Informant  B Complex-C (B-COMPLEX WITH VITAMIN C) tablet [id number] Yes Take by mouth. [provider] Taking Active   busPIRone (BUSPAR) 10  MG tablet [id number] Yes Take 10 mg by mouth 3 (three) times daily. [provider] Taking Active   Cholecalciferol 25 MCG (1000 UT) tablet [id number] Yes Take by mouth. [provider] Taking Active            Med Note Vicki Newman   Tue Mar 23, 2022 10:37 AM)    FLUoxetine (PROZAC) 10 MG capsule [id number] Yes Take 10 mg by mouth daily. Take with 40 mg [provider] Taking Active   FLUoxetine (PROZAC) 40 MG capsule [id number] Yes Take 1 capsule (40 mg total) by mouth daily.  Patient taking differently: Take 40 mg by mouth daily. Take with 10 mg   Newman, Berdie Ogren, NP Taking Active   hydrOXYzine (VISTARIL) 25 MG capsule [id number] Yes Take 25 mg by mouth at bedtime. [provider] Taking Active   lamoTRIgine (LAMICTAL) 150 MG tablet [id number] Yes Take 1 tablet (150 mg total) by mouth 2 (two) times daily. Newman, Berdie Ogren, NP Taking Active   lithium carbonate (ESKALITH) 450 MG CR tablet [id number] Yes Take 1 tablet (450 mg total) by mouth at bedtime. Vicki Pancoast, FNP Taking Active   omeprazole (PRILOSEC) 20 MG capsule [id number] Yes Take 1 capsule (20 mg total) by mouth daily. Vicki Sewer, NP Taking Active            Med Note Vicki Newman   Tue Mar 23, 2022 10:37 AM)              Patient Active Problem List   Diagnosis Date Noted   Bilateral impacted cerumen 03/16/2022   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   Bipolar 2  disorder, major depressive episode ([place]) 03/15/2022   Muscle spasm 03/15/2022   Neck pain 03/15/2022   Serum calcium elevated 03/15/2022   Elevated serum creatinine 03/15/2022   At risk for polypharmacy 03/15/2022   Dizziness and giddiness 03/15/2022   Dysequilibrium 03/15/2022   Vitamin D deficiency 03/15/2022   Stress incontinence 11/18/2021   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   Gastroesophageal reflux disease without esophagitis 11/18/2021   Allergic rhinitis due to animal hair and dander 11/18/2021    Immunization History  Administered Date(s) Administered   Influenza,inj,Quad PF,6+ Mos 09/07/2018, 07/27/2019, 11/18/2021   Moderna Sars-Covid-2 Vaccination 01/11/2020, 02/08/2020    Assessment/Plan  Bipolar 2 disorder (Goal: manage symptoms) -Current treatment: Fluoxetine 10 mg daily AM - 2020 Fluoxetine 40 mg daily AM Buspirone 10 mg TID - 07/2021 Lithium carbonate CR 450 mg daily PM - 2013 Lamotrigine 150 gm BID -2020 Hydroxyzine 25 mg HS - 01/2022 -Medications previously tried/failed: mirazapine, seroquel, temazepam; pt reports she has been on "most antipsychotics" except risperidone -Symptoms are relatively controlled currently - pt denied depression, mania on current regimen, reports anxiety is "pretty bad' right now; she is about to undergo intensive outpatient therapy over the next few weeks for help with anxiety -Pt reports frequent falls resulting in at least 2 concussions (starting in 2020, she has falls every few months, she reports she has seen 2 neurologists who have not found clear etiology, and have now referred to ENT); she reports she feels dizzy before she falls, and this tends to occur when she is more tired or anxious -Connected with Vicki Newman for mental health support -Reviewed medications at length - it is possible that any/all of these are contributing to dizziness, pt is in tough spot because mood is reportedly best controlled on current regimen than any other  regimen she's tried, tapering/changing medications  may result in loss of bipolar control - that being said, recent additions buspirone and hydroxyzine are most likely to cause dizziness, would recommend starting with buspar dose reduction as pt reports she has not noticed much benefit with this anyway. -Recommend reducing buspirone by 1/2 for 1-2 weeks; can likely d/c altogether in a few weeks. Pt to consult with psychiatric provider Vicki Newman before making changes.  GERD (Goal: manage symptoms) -Controlled -Current treatment  Omeprazole 20 mg daily PM -Medications previously tried: n/a  -Recommended to continue current medication   Follow up: Telephone follow up appointment with care management team member scheduled for:  5 weeks  Charlene Brooke, PharmD, BCACP Clinical Pharmacist [facility] at [facility] [phone]

## 2022-03-23 NOTE — Patient Instructions (Signed)
Visit Information  Phone number for Pharmacist: [phone]  -Recommend reducing buspirone by 1/2 for 1-2 weeks; can likely d/c altogether in a few weeks. Pt to consult with psychiatric provider Loura Halt before making changes.  Patient verbalizes understanding of instructions and care plan provided today and agrees to view in MyChart. Active MyChart status and patient understanding of how to access instructions and care plan via MyChart confirmed with patient.    Telephone follow up appointment with pharmacy team member scheduled for: 5 weeks  Al Corpus, PharmD, BCACP Clinical Pharmacist [facility] at [facility] [phone]

## 2022-03-25 ENCOUNTER — Ambulatory Visit: Payer: BC Managed Care – PPO | Admitting: Family

## 2022-03-30 ENCOUNTER — Encounter: Payer: Self-pay | Admitting: *Deleted

## 2022-03-30 ENCOUNTER — Encounter: Payer: Self-pay | Admitting: Family

## 2022-03-30 ENCOUNTER — Ambulatory Visit (INDEPENDENT_AMBULATORY_CARE_PROVIDER_SITE_OTHER): Payer: BC Managed Care – PPO | Admitting: Family

## 2022-03-30 VITALS — BP 104/62 | HR 73 | Temp 98.5°F | Resp 16 | Ht 65.0 in | Wt 133.4 lb

## 2022-03-30 DIAGNOSIS — M4003 Postural kyphosis, cervicothoracic region: Secondary | ICD-10-CM

## 2022-03-30 DIAGNOSIS — M542 Cervicalgia: Secondary | ICD-10-CM

## 2022-03-30 DIAGNOSIS — H6123 Impacted cerumen, bilateral: Secondary | ICD-10-CM

## 2022-03-30 DIAGNOSIS — M25511 Pain in right shoulder: Secondary | ICD-10-CM

## 2022-03-30 DIAGNOSIS — Z1231 Encounter for screening mammogram for malignant neoplasm of breast: Secondary | ICD-10-CM | POA: Insufficient documentation

## 2022-03-30 DIAGNOSIS — Z9189 Other specified personal risk factors, not elsewhere classified: Secondary | ICD-10-CM

## 2022-03-30 DIAGNOSIS — G8929 Other chronic pain: Secondary | ICD-10-CM

## 2022-03-30 DIAGNOSIS — Z780 Asymptomatic menopausal state: Secondary | ICD-10-CM

## 2022-03-30 NOTE — Assessment & Plan Note (Signed)
Pt met and d/w pharmacy tech who reduced buspirone with psychiatry

## 2022-03-30 NOTE — Assessment & Plan Note (Signed)
Bone density ordered pending results.   

## 2022-03-30 NOTE — Assessment & Plan Note (Signed)
Chronic Vitamin D high not low pth normal Not supplementing with calcium Ordering bone scan, pending insurance

## 2022-03-30 NOTE — Assessment & Plan Note (Signed)
Mammogram ordered. Pending results. 

## 2022-03-30 NOTE — Progress Notes (Signed)
Established Patient Office Visit  Subjective:  Patient ID: Vicki Newman, female    DOB: [birth year]  Age: 61 y.o. MRN: [id number]  CC:  Chief Complaint  Patient presents with  . Cerumen Impaction  . Neck Pain    X 1 month    HPI Vicki Newman is here today for follow up.  Pt is without acute concerns.  Vitamin b12 JHE:RDEY daily b complex  Vitamin D: Last vitamin D elevated, so suggest lower dosing as pt has been taking 7000 IU once daily Lab Results  Component Value Date   VD25OH 79.25 03/15/2022    Still with elevated calcium at 10.6 last visit.  Not currently taking calcium supplements. Not taking tums daily or anything. Pth normal last visit.   Bil impacted cerumen: pt with impaction, was told to utilize one week of debrox and come back for irrigation if needed.  Cervicalgia: was referred to physical therapy however pt would like to see and have referral to Dr. Tonita Cong at [facility].   Past Medical History:  Diagnosis Date  . Allergy   . Anxiety   . Bipolar 1 disorder ([place])    with mania  . Bone spur    shoulder  . Depression   . Falls     Past Surgical History:  Procedure Laterality Date  . HERNIA REPAIR  2000  . OVARIAN CYST REMOVAL Bilateral    bil    Family History  Problem Relation Age of Onset  . Hypertension Mother   . Hearing loss Mother   . Depression Mother   . Cancer Mother   . Arthritis Mother   . Alcohol abuse Mother   . Mental illness Mother   . Mental illness Father   . Hearing loss Father   . Depression Father   . Arthritis Father   . Alcohol abuse Father   . Hyperlipidemia Brother   . Depression Brother   . Alcohol abuse Brother   . Mental illness Brother   . Depression Brother   . Cancer Maternal Grandmother   . COPD Paternal Grandfather     Social History   Socioeconomic History  . Marital status: Married    Spouse name: Not on file  . Number of children: 3  . Years of education: Not on file  . Highest  education level: Not on file  Occupational History  . Occupation: retired  Tobacco Use  . Smoking status: Never  . Smokeless tobacco: Never  Vaping Use  . Vaping Use: Never used  Substance and Sexual Activity  . Alcohol use: Never  . Drug use: Never  . Sexual activity: Yes    Partners: Male    Birth control/protection: Post-menopausal  Other Topics Concern  . Not on file  Social History Narrative   Three girls   Retired Sports coach   Social Determinants of Radio broadcast assistant Strain: Not on file  Food Insecurity: Not on file  Transportation Needs: Not on file  Physical Activity: Not on file  Stress: Not on file  Social Connections: Not on file  Intimate Partner Violence: Not on file    Outpatient Medications Prior to Visit  Medication Sig Dispense Refill  . B Complex-C (B-COMPLEX WITH VITAMIN C) tablet Take by mouth.    . B Complex-C (B-COMPLEX WITH VITAMIN C) tablet Take 1 tablet by mouth daily.    . busPIRone (BUSPAR) 10 MG tablet Take 15 mg by mouth 2 (two) times daily.    Marland Kitchen  Cholecalciferol 25 MCG (1000 UT) tablet Take by mouth.    Marland Kitchen FLUoxetine (PROZAC) 10 MG capsule Take 10 mg by mouth daily. Take with 40 mg    . FLUoxetine (PROZAC) 40 MG capsule Take 1 capsule (40 mg total) by mouth daily. (Patient taking differently: Take 40 mg by mouth daily. Take with 10 mg) 90 capsule 3  . hydrOXYzine (VISTARIL) 25 MG capsule Take 25 mg by mouth at bedtime.    . lamoTRIgine (LAMICTAL) 150 MG tablet Take 1 tablet (150 mg total) by mouth 2 (two) times daily. 180 tablet 1  . lithium carbonate (ESKALITH) 450 MG CR tablet Take 1 tablet (450 mg total) by mouth at bedtime. 90 tablet 1  . Magnesium 200 MG CHEW Chew 200 mg by mouth 2 (two) times daily.    Marland Kitchen omeprazole (PRILOSEC) 20 MG capsule Take 1 capsule (20 mg total) by mouth daily. 90 capsule 1   No facility-administered medications prior to visit.    Allergies  Allergen Reactions  . Sulfa Antibiotics Rash  .  Valacyclovir     Other reaction(s): Dizziness (intolerance)        Objective:    Physical Exam  Gen: NAD, resting comfortably HEENT: TMs normal bilaterally. OP clear. No thyromegaly noted.  CV: RRR with no murmurs appreciated Pulm: NWOB, CTAB with no crackles, wheezes, or rhonchi GI: Normal bowel sounds present. Soft, Nontender, Nondistended. MSK: no edema, cyanosis, or clubbing noted Skin: warm, dry Psych: Normal affect and thought content  BP 104/62   Pulse 73   Temp 98.5 F (36.9 C)   Resp 16   Ht '5\' 5"'  (1.651 m)   Wt 133 lb 7 oz (60.5 kg)   SpO2 98%   BMI 22.21 kg/m  Wt Readings from Last 3 Encounters:  03/30/22 133 lb 7 oz (60.5 kg)  03/15/22 133 lb 9 oz (60.6 kg)  02/23/22 134 lb (60.8 kg)     Health Maintenance Due  Topic Date Due  . Zoster Vaccines- Shingrix (1 of 2) Never done  . PAP SMEAR-Modifier  Never done  . COVID-19 Vaccine (3 - Moderna risk series) 03/07/2020    There are no preventive care reminders to display for this patient.  Lab Results  Component Value Date   TSH 2.31 12/17/2021   Lab Results  Component Value Date   WBC 6.4 03/15/2022   HGB 14.3 03/15/2022   HCT 43.5 03/15/2022   MCV 93.8 03/15/2022   PLT 366.0 03/15/2022   Lab Results  Component Value Date   NA 142 03/15/2022   K 4.6 03/15/2022   CO2 27 03/15/2022   GLUCOSE 84 03/15/2022   BUN 23 03/15/2022   CREATININE 1.07 03/15/2022   BILITOT 0.4 03/15/2022   ALKPHOS 96 03/15/2022   AST 17 03/15/2022   ALT 20 03/15/2022   PROT 7.1 03/15/2022   ALBUMIN 4.6 03/15/2022   CALCIUM 10.5 03/15/2022   CALCIUM 10.6 (H) 03/15/2022   EGFR 54 (L) 09/02/2021   GFR 56.53 (L) 03/15/2022   No results found for: CHOL No results found for: HDL No results found for: LDLCALC No results found for: TRIG No results found for: CHOLHDL No results found for: HGBA1C    Assessment & Plan:   Problem List Items Addressed This Visit       Nervous and Auditory   Bilateral impacted  cerumen    Ceruminosis is noted.  Obtained verbal patient consent prior to procedure, possible risks of procedure discussed with pt prior,  and then Wax was removed by  manual debridement by me with curette. Instructions for home care to prevent wax buildup are given and handout provided to pt .pt tolerated procedure well.          Musculoskeletal and Integument   Postural kyphosis of cervicothoracic region   Relevant Orders   AMB referral to orthopedics     Other   Neck pain    Referral to orthopedist as chronic Reviewed cervical spine xray       Relevant Orders   AMB referral to orthopedics   NM Bone Scan 3 Phase   At risk for polypharmacy    Pt met and d/w pharmacy tech who reduced buspirone with psychiatry       Chronic right shoulder pain - Primary   Relevant Orders   AMB referral to orthopedics   Postmenopausal    Bone density ordered pending results.         Relevant Orders   DG Bone Density   Screening mammogram for breast cancer    Mammogram ordered. Pending results.        Relevant Orders   MM 3D SCREEN BREAST BILATERAL   Hypercalcemia    Chronic Vitamin D high not low pth normal Not supplementing with calcium Ordering bone scan, pending insurance       Relevant Orders   NM Bone Scan 3 Phase    No orders of the defined types were placed in this encounter.   Follow-up: No follow-ups on file.    Eugenia Pancoast, FNP

## 2022-03-30 NOTE — Assessment & Plan Note (Signed)
Ceruminosis is noted.  Obtained verbal patient consent prior to procedure, possible risks of procedure discussed with pt prior, and then Wax was removed by  manual debridement by me with curette. Instructions for home care to prevent wax buildup are given and handout provided to pt .pt tolerated procedure well.

## 2022-03-30 NOTE — Assessment & Plan Note (Signed)
Referral to orthopedist as chronic Reviewed cervical spine xray

## 2022-03-30 NOTE — Patient Instructions (Addendum)
A referral was placed today for orthopedist that you requested.  Please let us know if you have not heard back within 2 weeks about the referral.  You have an appointment scheduled for: []   2D Mammogram  [x]   3D Mammogram  [x]   Bone Density   Your appointment will at the following location, please call to schedule   [x]   [facility]  [place] [address]  [phone]  Make sure to wear two piece  clothing  No lotions powders or deodorants the day of the appointment Make sure to bring picture ID and insurance card.  Bring list of medications you are currently taking including any supplements.   I have also ordered a bone scan for you, please wait to hear from referrals on how to get this scheduled.

## 2022-04-02 ENCOUNTER — Telehealth: Payer: Self-pay | Admitting: Family

## 2022-04-02 NOTE — Telephone Encounter (Signed)
Pt called and stated that she had x-ray done last week and pt stated that she wanted it on a CD

## 2022-04-27 ENCOUNTER — Telehealth: Payer: Self-pay | Admitting: Pharmacist

## 2022-04-27 ENCOUNTER — Telehealth: Payer: BC Managed Care – PPO

## 2022-05-17 NOTE — Therapy (Signed)
OUTPATIENT PHYSICAL THERAPY CERVICAL EVALUATION   Patient Name: Vicki Newman MRN: [id number] DOB:03/10/1961, 61 y.o., female Today's Date: 05/18/2022   PT End of Session - 05/18/22 1449     Visit Number 1    Number of Visits 9    Date for PT Re-Evaluation 07/20/22    Authorization Type BCBS    PT Start Time 1148    PT Stop Time 1245    PT Time Calculation (min) 57 min    Activity Tolerance Patient tolerated treatment well    Behavior During Therapy WFL for tasks assessed/performed             Past Medical History:  Diagnosis Date   Allergy    Anxiety    Bipolar 1 disorder ([facility])    with mania   Bone spur    shoulder   Depression    Falls    Past Surgical History:  Procedure Laterality Date   HERNIA REPAIR  2000   OVARIAN CYST REMOVAL Bilateral    bil   Patient Active Problem List   Diagnosis Date Noted   Chronic right shoulder pain 03/30/2022   Postmenopausal 03/30/2022   Screening mammogram for breast cancer 03/30/2022   Hypercalcemia 03/30/2022   Bilateral impacted cerumen 03/16/2022   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   Bipolar 2 disorder, major depressive episode ([facility]) 03/15/2022   Neck pain 03/15/2022   At risk for polypharmacy 03/15/2022   Dizziness and giddiness 03/15/2022   Dysequilibrium 03/15/2022   Vitamin D deficiency 03/15/2022   Stress incontinence 11/18/2021   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   Gastroesophageal reflux disease without esophagitis 11/18/2021   Allergic rhinitis due to animal hair and dander 11/18/2021    PCP: Mort Sawyers, FNP  REFERRING PROVIDER: Jene Every, MD  REFERRING DIAG: M54.12 (ICD-10-CM) - Radiculopathy, cervical region  THERAPY DIAG:  Cervicalgia  Muscle weakness (generalized)  Abnormal posture  Unsteadiness on feet  Rationale for Evaluation and Treatment Rehabilitation  ONSET DATE: over last 2 months neck pain has increased  SUBJECTIVE:                                                                                                                                                                                                          SUBJECTIVE STATEMENT: I moved here about 9 months to be near family.  I have had a lot of stress with the move.  I was born in [place], [place].  I can't sleep on my left side . I try to improve my posture my traps and neck  can't handle it. I cant' move my neck as well as I did. I do feel off balance.  I am going to an ENT to see if my balance is off because of my inner ear Friday.  I do have dizziness at times. There is no rhyme or reason. -possible due to medications but I am investigating.  I cannot read in bed.  I drive myself but it is difficult to turn my head to look at blind spots. I felt good visiting my daughter out of town to get rid of stress( my mother dies 9 months ago, move, my husband went to nights OT at [employer] plant, but he took another job /unhappy in job/ not seeing family as much as I would like. Life is not what I expected) from being in town.  PERTINENT HISTORY:  Neck pain chronic, chronic R shld pain, postural kyphosis or cervicothoracic region, dysequilibrium, dizziness,  bipolar disorder. Incontinence managed with pelvic floor ex, GERD,  Scoliosis and increased lordosis  PAIN:  Are you having pain? Yes: NPRS scale: 5/10 at rest in bil upper traps more on Left than R  at worst it is 9/10 Pain location: cervical and bil upper traps Pain description: sometimes sharp and sometimes aching/throbbing Aggravating factors: sleeping on left sides , turning head while driving, lifting arms above shoulders , stress with move. Relieving factors: heat and cold. Muscle relaxers  PRECAUTIONS: Fall  WEIGHT BEARING RESTRICTIONS No  FALLS:  Has patient fallen in last 6 months? Yes. Number of falls 1  I got out of bed and getting something off my dresser and I fell and hit my head.  I have had 3 concussions.  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 Multilevel degenerative disc disease in the cervical spine, most prominent at C5-C6 and C6-C7. 2. Facet hypertrophy at C3-C4 and C4-C5 bilaterally. Bony neural foraminal narrowing at C3-C4 and C4-C5 on the right.  PATIENT SURVEYS:  FOTO 44% predicted 58%   COGNITION: Overall cognitive status: Within functional limits for tasks assessed   SENSATION: Tingling in R middle 3 digits  POSTURE: rounded shoulders, forward head, increased thoracic kyphosis, and left hip higher than Right Scoliosis   Occiput to wall 6 cm. ( > 10 cm fall risk)  PALPATION: TTP over bil Upper traps , cervical paraspinals and suboccipitals. R Shld flexion at end range pain   CERVICAL ROM:   Active ROM A/PROM (deg) eval  Flexion 55 tight only  Extension 41 pain  Right lateral flexion 17  Left lateral flexion 10 with pain  Right rotation 42  Left rotation 32 pain worse than R   (Blank rows = not tested)  UPPER EXTREMITY ROM:  Active ROM Right eval Left eval  Shoulder flexion 150/157 P 142/155 P  Shoulder extension    Shoulder abduction    Shoulder adduction    Shoulder extension    Shoulder internal rotation T-12 L-1  Shoulder external rotation T-1 T-1  Elbow flexion    Elbow extension    Wrist flexion    Wrist extension    Wrist ulnar deviation    Wrist radial deviation  Wrist pronation    Wrist supination     (Blank rows = not tested)  UPPER EXTREMITY MMT:  MMT Right eval Left eval  Shoulder flexion 4+ 4  Shoulder extension 4+ 4+  Shoulder  abduction    Shoulder adduction    Shoulder extension    Shoulder internal rotation 4+ 4  Shoulder external rotation 4 4  Middle trapezius    Lower trapezius    Grip strength 50 lb 31.33 lb   (Blank rows = not tested)  CERVICAL SPECIAL TESTS:  Neck flexor muscle endurance test: Positive, Spurling's test: Negative, and Distraction test: Negative   FUNCTIONAL TESTS:  5 times sit to stand: 11.71 6 minute walk test: TBD Berg Balance Scale: 49/56 BERG BALANCE  Sitting to Standing: Numbers; 0-4: 4  4. Stands without using hands and stabilize independently  3. Stands independently using hands  2. Stands using hands after multiple trials  1. Min A to stand  0. Mod-Max A to stand Standing unsupported: Numbers; 0-4: 4  4. Stands safely for 2 minutes  3. Stands 2 minutes with supervision  2. Stands 30 seconds unsupported  1. Needs several tries to stand unsupported for 30 seconds  0. Unable to stand unsupported for 30 seconds Sitting unsupported: Numbers; 0-4: 4  4. Sits for 2 minutes independently  3. Sits for 2 minutes with supervision  2. Able to sit 30 seconds  1. Able to sit 10 seconds  0. Unable to sit for 10 seconds Standing to Sitting: Numbers; 0-4: 4 4. Sits safely with minimal use of hands 3. Controls descent with hands 2. Uses back of legs against chair to control descent 1. Sits independently, but uncontrolled descent 0. Needs assistance Transfers: Numbers; 0-4: 4  4. Transfers safely with minor use of hands  3. Transfers safely definite use of hands  2. Transfers with verbal cueing/supervision  1. Needs 1 person assist  0. Needs 2 person assist  Standing with eyes closed: Numbers; 0-4: 4  4. Stands safely for 10 seconds  3. Stands 10 seconds with supervision   2. Able to stand for 3 seconds  1. Unable to keep eyes closed for 3 seconds, but is safe  0. Needs assist to keep from falling Standing with feet together: Numbers; 0-4: 4 4. Stands for 1 minute  safely 3. Stands for 1 minute with supervision 2. Unable to hold for 30 seconds  1. Needs help to attain position but can hold for 15 seconds  0. Needs help to attain position and unable to hold for 15 seconds Reaching forward with outstretched arm: Numbers; 0-4: 4  4. Reaches forward 10 inches  3. Reaches forward 5 inches  2. Reaches forward 2 inches  1. Reaches forward with supervision  0. Loses balance/requires assistace Retrieving object from the floor: Numbers; 0-4: 4 4. Able to pick up easily and safely 3. Able to pick up with supervision 2. Unable to pick up, but reaches within 1-2 inches independently 1. Unable to pick up and needs supervision 0. Unable/needs assistance to keep from falling  Turning to look behind: Numbers; 0-4: 4  4. Looks behind from both sides and weight shifts well  3. Looks behind one side only, other side less weight shift  2. Turns sideways only, maintains balance  1. Needs supervision when turning  0. Needs assistance  Turning 360 degrees: Numbers; 0-4: 4  4. Able to turn in </=4 seconds  3. Able to turn on one side in </=  4 seconds   2. Able to turn slowly, but safely  1. Needs supervision or verbal cueing  0. Needs assistance Place alternate foot on stool: Numbers; 0-4: 4 4. Completes 8 steps in 20 seconds 3. Completes 8 steps in >20 seconds 2. 4 steps without assistance/supervision 1. Completes >2 steps with minimal assist 0. Unable, needs assist to keep from falling Standing with one foot in front: Numbers; 0-4: 0  4. Independent tandem for 30 seconds  3. Independent foot ahead for 30 seconds  2. Independent small step for 30 seconds  1. Needs help to step, but can hold for 15 seconds  0. Loses balance while standing/stepping Standing on one foot: Numbers; 0-4: 1 4. Holds >10 seconds 3. Holds 5-10 seconds 2. Holds >/=3 seconds  1. Holds <3 seconds 0. Unable   Total Score: 49/56   TODAY'S TREATMENT:  05-18-22  EVAL and FOTO report  and Issue HEP   PATIENT EDUCATION:  Education details:   Person educated: Patient Education method: Programmer, multimedia, Facilities manager, Actor cues, Verbal cues, and Handouts Education comprehension: verbalized understanding, returned demonstration, verbal cues required, tactile cues required, and needs further education   HOME EXERCISE PROGRAM: Access Code: [email or web address] URL: [email or web address] Date: 05/18/2022 Prepared by: Garen Lah  Exercises - Seated Gentle Upper Trapezius Stretch  - 1 x daily - 7 x weekly - 1 sets - 3 reps - 30 hold - Gentle Levator Scapulae Stretch  - 1 x daily - 7 x weekly - 3 sets - 3 reps - 30 hold - Supine Deep Neck Flexor Training  - 2 x daily - 7 x weekly - 10 reps - 10 hold - Supine Deep Neck Flexor Training - Repetitions  - 2 x daily - 7 x weekly - 10 reps - 3 hold - Sit to stand with sink support Movement snack  - 3 x daily - 7 x weekly - 10 reps  ASSESSMENT:  CLINICAL IMPRESSION: Patient is a 61  y.o. female who was seen today for physical therapy evaluation and treatment for cervical spondylosis and report of balance issue/vestibular issues.  BERG 49/56 and describes falling in last 6 months and hx of 3 concussions.  Pt has limited cervical AROM and occiput to wall 6 cm. ( Risk of fall 10 cm).  Ms Laird has history of enjoying exercise and would like to return to exercise and working in her future garden once she moves. Pt with DNF weakness and cannot tolerate longer than 7 sec ( normal 35 sec in supine DNF left in supine) Pt will benefit from skilled PT to address cervicalgia and balance deficits with possible vestibular problems.     OBJECTIVE IMPAIRMENTS decreased balance, decreased knowledge of use of DME, decreased ROM, decreased strength, dizziness, impaired UE functional use, improper body mechanics, postural dysfunction, and pain.   ACTIVITY LIMITATIONS carrying, sleeping, reach over head, locomotion level, and  drving  PARTICIPATION LIMITATIONS: driving and yard work  PERSONAL FACTORS Neck pain chronic, chronic R shld pain, postural kyphosis or cervicothoracic region, dysequilibrium, dizziness,  bipolar disorder. Incontinence managed with pelvic floor ex, GERD,  Scoliosis and increased lordosis are also affecting patient's functional outcome.   REHAB POTENTIAL: Good  CLINICAL DECISION MAKING: Evolving/moderate complexity  EVALUATION COMPLEXITY: Moderate   GOALS: Goals reviewed with patient? Yes  SHORT TERM GOALS: Target date: 06/15/2022   Independent with initial HEP Baseline: limited knowledge Goal status: INITIAL  2.  Demonstrate understanding of proper sitting posture, body mechanics, work ergonomics, and be more  conscious of position and posture throughout the day.  Baseline: unaware of posture throughout day Goal status: INITIAL  3.  Pt will increase AROM of cervical rotation by 25 % Baseline: See flow chart for AROM cervical Goal status: INITIAL    LONG TERM GOALS: Target date: 07/13/2022  Demonstrate and verbalize techniques to reduce the risk of re-injury including: lifting, posture, body mechanics.  Baseline: limited knowledge Goal status: INITIAL  2.  Pt will increase balance  in order to be able to stand SLS for 15 sec or more to show improvement of SLS at evaluation  Baseline: Eval R and L 3 sec or less Goal status: INITIAL  3. Pt headaches will be reduce to 3-4 times a week  Baseline: pt reports headaches several times a day Goal status: INITIAL  4.  Pt will be able to lift kitchen items to shelf overhead 15 # Baseline: Unable to lift items above head Goal status: INITIAL  5.  FOTO will improve from  44%  to  58%   indicating improved functional mobility .  Baseline: Eval 44% Goal status: INITIAL  6.  Pt will report confidence in self management of condition at time of discharge with advanced HEP Baseline: Pt with no HEP for condition at present/eval Goal  status: INITIAL  7. Pt will improve balance to 45/56 BERG from eval  Baseline: 49/56  Goal Status INITIAL  8. Pt will be able to drive and turn head with 34% greater ease  Baseline: Pt limited by pain and AROM  See flow chart  Goal Status INITIAL     PLAN: PT FREQUENCY: 1-2x/week  PT DURATION: 8 weeks  PLANNED INTERVENTIONS: Therapeutic exercises, Therapeutic activity, Neuromuscular re-education, Balance training, Gait training, Patient/Family education, Self Care, Joint mobilization, Stair training, DME instructions, Dry Needling, Electrical stimulation, Cryotherapy, Moist heat, Taping, Traction, Ionotophoresis 4mg /ml Dexamethasone, Manual therapy, and Re-evaluation Vestibular  PLAN FOR NEXT SESSION: TPDN, Review HEP and progress as able, balance   , PT, [facility] Certified Exercise Expert for the Aging Adult  05/18/22 3:01 PM Phone: [phone] Fax: [phone]

## 2022-05-18 ENCOUNTER — Encounter: Payer: Self-pay | Admitting: Physical Therapy

## 2022-05-18 ENCOUNTER — Ambulatory Visit: Payer: BC Managed Care – PPO | Attending: Specialist | Admitting: Physical Therapy

## 2022-05-18 DIAGNOSIS — R2681 Unsteadiness on feet: Secondary | ICD-10-CM | POA: Insufficient documentation

## 2022-05-18 DIAGNOSIS — M6281 Muscle weakness (generalized): Secondary | ICD-10-CM | POA: Insufficient documentation

## 2022-05-18 DIAGNOSIS — R293 Abnormal posture: Secondary | ICD-10-CM | POA: Diagnosis present

## 2022-05-18 DIAGNOSIS — M542 Cervicalgia: Secondary | ICD-10-CM | POA: Diagnosis not present

## 2022-05-18 NOTE — Patient Instructions (Signed)
Posture Tips DO: - stand tall and erect - keep chin tucked in - keep head and shoulders in alignment - check posture regularly in mirror or large window - pull head back against headrest in car seat;  Change your position often.  Sit with lumbar support. DON'T: - slouch or slump while watching TV or reading - sit, stand or lie in one position  for too long;  Sitting is especially hard on the spine so if you sit at a desk/use the computer, then stand up often!   Copyright  VHI. All rights reserved.  Posture - Standing   Good posture is important. Avoid slouching and forward head thrust. Maintain curve in low back and align ears over shoul- ders, hips over ankles.  Pull your belly button in toward your back bone. Stand with ribs lifted up and chin down. Not [employer] or the [employer].  Keep feet with weight in great toe .little toe and heel   Copyright  VHI. All rights reserved.  Posture - Sitting   Sit upright, head facing forward. Try using a roll to support lower back. Keep shoulders relaxed, and avoid rounded back. Keep hips level with knees. Avoid crossing legs for long periods. Sit on sit bones and not tail bone.  Do not cross legs.  Keep posture ribs up and chin down.   Copyright  VHI. All rights reserved.   Garen Lah, PT, ATRIC Certified Exercise Expert for the Aging Adult  05/18/22 12:36 PM Phone: [phone] Fax: [phone]

## 2022-06-03 ENCOUNTER — Ambulatory Visit: Payer: BC Managed Care – PPO | Admitting: Physical Therapy

## 2022-06-04 ENCOUNTER — Telehealth: Payer: Self-pay | Admitting: Family

## 2022-06-04 NOTE — Telephone Encounter (Signed)
Patient called in asking about results from Ear ,Nose,and throat. She said that they are giving her the run around about them,and she would like to know if we have them?

## 2022-06-08 ENCOUNTER — Encounter: Payer: Self-pay | Admitting: Physical Therapy

## 2022-06-08 ENCOUNTER — Ambulatory Visit: Payer: BC Managed Care – PPO | Attending: Specialist | Admitting: Physical Therapy

## 2022-06-08 DIAGNOSIS — R279 Unspecified lack of coordination: Secondary | ICD-10-CM | POA: Insufficient documentation

## 2022-06-08 DIAGNOSIS — R269 Unspecified abnormalities of gait and mobility: Secondary | ICD-10-CM | POA: Diagnosis present

## 2022-06-08 DIAGNOSIS — R252 Cramp and spasm: Secondary | ICD-10-CM | POA: Diagnosis present

## 2022-06-08 DIAGNOSIS — M6281 Muscle weakness (generalized): Secondary | ICD-10-CM | POA: Insufficient documentation

## 2022-06-08 DIAGNOSIS — M542 Cervicalgia: Secondary | ICD-10-CM | POA: Insufficient documentation

## 2022-06-08 DIAGNOSIS — R293 Abnormal posture: Secondary | ICD-10-CM | POA: Insufficient documentation

## 2022-06-08 DIAGNOSIS — R2681 Unsteadiness on feet: Secondary | ICD-10-CM | POA: Insufficient documentation

## 2022-06-08 NOTE — Therapy (Addendum)
OUTPATIENT PHYSICAL THERAPY TREATMENT NOTE/Discharge Note PHYSICAL THERAPY DISCHARGE SUMMARY  Visits from Start of Care: 2  Current functional level related to goals / functional outcomes: Last known as below   Remaining deficits: Vestibular deficits and being sent to Neuro Rehab   Education / Equipment: Initial HEp for balance   Patient agrees to discharge. Patient goals were not met. Patient is being discharged due to  being deferred to Vestibular/ Neuro rehab for evaluation.    Patient Name: Vicki Newman MRN: [id number] DOB:1961-10-02, 61 y.o., female Today's Date: 06/08/2022  PCP: Mort Sawyers, FNP REFERRING PROVIDER: Jene Every, MD  END OF SESSION:   PT End of Session - 06/08/22 1118     Visit Number 2    Number of Visits 9    Date for PT Re-Evaluation 07/20/22    Authorization Type BC[employer]    PT Start Time 1105    PT Stop Time 1145    PT Time Calculation (min) 40 min             Past Medical History:  Diagnosis Date   Allergy    Anxiety    Bipolar 1 disorder (HCC)    with mania   Bone spur    shoulder   Depression    Falls    Past Surgical History:  Procedure Laterality Date   HERNIA REPAIR  2000   OVARIAN CYST REMOVAL Bilateral    bil   Patient Active Problem List   Diagnosis Date Noted   Chronic right shoulder pain 03/30/2022   Postmenopausal 03/30/2022   Screening mammogram for breast cancer 03/30/2022   Hypercalcemia 03/30/2022   Bilateral impacted cerumen 03/16/2022   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   Bipolar 2 disorder, major depressive episode (HCC) 03/15/2022   Neck pain 03/15/2022   At risk for polypharmacy 03/15/2022   Dizziness and giddiness 03/15/2022   Dysequilibrium 03/15/2022   Vitamin D deficiency 03/15/2022   Stress incontinence 11/18/2021   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   Gastroesophageal reflux disease without esophagitis 11/18/2021   Allergic rhinitis due to animal hair and dander 11/18/2021     REFERRING DIAG: M54.12 (ICD-10-CM) - Radiculopathy, cervical region  THERAPY DIAG:  Cervicalgia  Muscle weakness (generalized)  Abnormal posture  Unsteadiness on feet  Lack of coordination  Cramp and spasm  Abnormality of gait and mobility  Rationale for Evaluation and Treatment Rehabilitation  PERTINENT HISTORY: Neck pain chronic, chronic R shld pain, postural kyphosis or cervicothoracic region, dysequilibrium, dizziness,  bipolar disorder. Incontinence managed with pelvic floor ex, GERD,  Scoliosis and increased lordosis  PRECAUTIONS: fall FALLS:  Has patient fallen in last 6 months? Yes. Number of falls 1  I got out of bed and getting something off my dresser and I fell and hit my head.  I have had 3 concussions.  SUBJECTIVE: I am a 4/10 pain today.  I am  not sure of my form with exercise.  I did see the ENT and I think my balance may be due to some vestibular issues. They want to send me to Neuro PT to check. I will see my PT next week and decide.   PAIN:  06-08-22  4/10 pain with soreness from exercises  Are you having pain? Yes: NPRS scale: 5/10 at rest in bil upper traps more on Left than R  at worst it is 9/10 Pain location: cervical and bil upper traps Pain description: sometimes sharp and sometimes aching/throbbing Aggravating factors: sleeping on left sides , turning  head while driving, lifting arms above shoulders , stress with move. Relieving factors: heat and cold. Muscle relaxers OBJECTIVE: (objective measures completed at initial evaluation unless otherwise dated)   OBJECTIVE:    DIAGNOSTIC FINDINGS: 03-16-22 IMPRESSION: 1. Multilevel degenerative disc disease in the cervical spine, most prominent at C5-C6 and C6-C7. 2. Facet hypertrophy at C3-C4 and C4-C5 bilaterally. Bony neural foraminal narrowing at C3-C4 and C4-C5 on the right.   PATIENT SURVEYS:  FOTO 44% predicted 58%     COGNITION: Overall cognitive status: Within functional limits for  tasks assessed     SENSATION: Tingling in R middle 3 digits   POSTURE: rounded shoulders, forward head, increased thoracic kyphosis, and left hip higher than Right Scoliosis    Occiput to wall 6 cm. ( > 10 cm fall risk)   PALPATION: TTP over bil Upper traps , cervical paraspinals and suboccipitals. R Shld flexion at end range pain         CERVICAL ROM:    Active ROM A/PROM (deg) eval  Flexion 55 tight only  Extension 41 pain  Right lateral flexion 17  Left lateral flexion 10 with pain  Right rotation 42  Left rotation 32 pain worse than R   (Blank rows = not tested)   UPPER EXTREMITY ROM:   Active ROM Right eval Left eval  Shoulder flexion 150/157 P 142/155 P  Shoulder extension      Shoulder abduction      Shoulder adduction      Shoulder extension      Shoulder internal rotation T-12 L-1  Shoulder external rotation T-1 T-1  Elbow flexion      Elbow extension      Wrist flexion      Wrist extension      Wrist ulnar deviation      Wrist radial deviation      Wrist pronation      Wrist supination       (Blank rows = not tested)   UPPER EXTREMITY MMT:   MMT Right eval Left eval  Shoulder flexion 4+ 4  Shoulder extension 4+ 4+  Shoulder abduction      Shoulder adduction      Shoulder extension      Shoulder internal rotation 4+ 4  Shoulder external rotation 4 4  Middle trapezius      Lower trapezius      Grip strength 50 lb 31.33 lb   (Blank rows = not tested)   CERVICAL SPECIAL TESTS:  Neck flexor muscle endurance test: Positive, Spurling's test: Negative, and Distraction test: Negative     FUNCTIONAL TESTS:  5 times sit to stand: 11.71 6 minute walk test: TBD Berg Balance Scale: 49/56 BERG BALANCE   Sitting to Standing: Numbers; 0-4: 4            4. Stands without using hands and stabilize independently            3. Stands independently using hands            2. Stands using hands after multiple trials            1. Min A to stand             0. Mod-Max A to stand Standing unsupported: Numbers; 0-4: 4            4. Stands safely for 2 minutes            3. Stands 2 minutes with supervision  2. Stands 30 seconds unsupported            1. Needs several tries to stand unsupported for 30 seconds            0. Unable to stand unsupported for 30 seconds Sitting unsupported: Numbers; 0-4: 4            4. Sits for 2 minutes independently            3. Sits for 2 minutes with supervision            2. Able to sit 30 seconds            1. Able to sit 10 seconds            0. Unable to sit for 10 seconds Standing to Sitting: Numbers; 0-4: 4 4. Sits safely with minimal use of hands 3. Controls descent with hands 2. Uses back of legs against chair to control descent 1. Sits independently, but uncontrolled descent 0. Needs assistance Transfers: Numbers; 0-4: 4            4. Transfers safely with minor use of hands            3. Transfers safely definite use of hands            2. Transfers with verbal cueing/supervision            1. Needs 1 person assist            0. Needs 2 person assist  Standing with eyes closed: Numbers; 0-4: 4            4. Stands safely for 10 seconds            3. Stands 10 seconds with supervision             2. Able to stand for 3 seconds            1. Unable to keep eyes closed for 3 seconds, but is safe            0. Needs assist to keep from falling Standing with feet together: Numbers; 0-4: 4 4. Stands for 1 minute safely 3. Stands for 1 minute with supervision 2. Unable to hold for 30 seconds            1. Needs help to attain position but can hold for 15 seconds            0. Needs help to attain position and unable to hold for 15 seconds Reaching forward with outstretched arm: Numbers; 0-4: 4            4. Reaches forward 10 inches            3. Reaches forward 5 inches            2. Reaches forward 2 inches            1. Reaches forward with supervision            0. Loses  balance/requires assistace Retrieving object from the floor: Numbers; 0-4: 4 4. Able to pick up easily and safely 3. Able to pick up with supervision 2. Unable to pick up, but reaches within 1-2 inches independently 1. Unable to pick up and needs supervision 0. Unable/needs assistance to keep from falling  Turning to look behind: Numbers; 0-4: 4            4. Looks behind from  both sides and weight shifts well            3. Looks behind one side only, other side less weight shift            2. Turns sideways only, maintains balance            1. Needs supervision when turning            0. Needs assistance  Turning 360 degrees: Numbers; 0-4: 4            4. Able to turn in </=4 seconds            3. Able to turn on one side in </= 4 seconds             2. Able to turn slowly, but safely            1. Needs supervision or verbal cueing            0. Needs assistance Place alternate foot on stool: Numbers; 0-4: 4 4. Completes 8 steps in 20 seconds 3. Completes 8 steps in >20 seconds 2. 4 steps without assistance/supervision 1. Completes >2 steps with minimal assist 0. Unable, needs assist to keep from falling Standing with one foot in front: Numbers; 0-4: 0            4. Independent tandem for 30 seconds            3. Independent foot ahead for 30 seconds            2. Independent small step for 30 seconds            1. Needs help to step, but can hold for 15 seconds            0. Loses balance while standing/stepping Standing on one foot: Numbers; 0-4: 1 4. Holds >10 seconds 3. Holds 5-10 seconds 2. Holds >/=3 seconds  1. Holds <3 seconds 0. Unable    Total Score: 49/56    TODAY'S TREATMENT:  [facility] Adult PT Treatment:                                                DATE: 06-08-22 Therapeutic Exercise: Seated Gentle Upper Trapezius Stretch 2 x 30 sec on R and on L Gentle Levator Scapulae Stretch  2 x 30 sec on R and on L Supine Deep Neck Flexor Training  hold for 10 sec x  10 Supine Deep neck flexor with flexion hold and rotate neck-  10X Sit to stand with sink support Movement snack  1 x 10 intro to tempo chair squat ER bil with RTB 1 x 10 Star pattern RTB 1 x 10 Manual Therapy: STW to bil upper trap and levator  Suboccipital release Trigger Point Dry Needling Treatment: Pre-treatment instruction: Patient instructed on dry needling rationale, procedures, and possible side effects including pain during treatment (achy,cramping feeling), bruising, drop of blood, lightheadedness, nausea, sweating. Patient Consent Given: Yes Education handout provided: Yes Muscles treated: bil upper trap and levator  Needle size and number: .25x1850mm x 4 Electrical stimulation performed: Yes Parameters:  20 pps with pt tolerance increased every 2 minutes Treatment response/outcome: Twitch response elicited and Palpable decrease in muscle tension Post-treatment instructions: Patient instructed to expect possible mild to moderate muscle soreness later today and/or tomorrow. Patient instructed  in methods to reduce muscle soreness and to continue prescribed HEP. If patient was dry needled over the lung field, patient was instructed on signs and symptoms of pneumothorax and, however unlikely, to see immediate medical attention should they occur. Patient was also educated on signs and symptoms of infection and to seek medical attention should they occur. Patient verbalized understanding of these instructions and education.   05-18-22  EVAL and FOTO report and Issue HEP     PATIENT EDUCATION:  Education details:    Person educated: Patient Education method: Programmer, multimedia, Facilities manager, Actor cues, Verbal cues, and Handouts Education comprehension: verbalized understanding, returned demonstration, verbal cues required, tactile cues required, and needs further education     HOME EXERCISE PROGRAM: Access Code: [email or web address] URL: [email or web address] Date:  05/18/2022 Prepared by: Garen Lah   Exercises - Seated Gentle Upper Trapezius Stretch  - 1 x daily - 7 x weekly - 1 sets - 3 reps - 30 hold - Gentle Levator Scapulae Stretch  - 1 x daily - 7 x weekly - 3 sets - 3 reps - 30 hold - Supine Deep Neck Flexor Training  - 2 x daily - 7 x weekly - 10 reps - 10 hold - Supine Deep Neck Flexor Training - Repetitions  - 2 x daily - 7 x weekly - 10 reps - 3 hold - Sit to stand with sink support Movement snack  - 3 x daily - 7 x weekly - 10 reps   ASSESSMENT:   CLINICAL IMPRESSION:  Ms Masella returns for 2nd visit and reports that she may have vestibular issues after seeing ENT which may be contributing to her balance issues.  Pt consents today to TPDN although she is very sensitive to touch today.  Pt was carefully monitored throughout session and responded favorably to TPDN and manual.  Pt with remarkably decreased tissue tension post RX and was able to perform exercises afterwards.  Ms Queener had questions about how to perform THer ex correctly and was guided to correct execution.  Will continue with POC.  EVAL -Patient is a 61  y.o. female who was seen today for physical therapy evaluation and treatment for cervical spondylosis and report of balance issue/vestibular issues.  BERG 49/56 and describes falling in last 6 months and hx of 3 concussions.  Pt has limited cervical AROM and occiput to wall 6 cm. ( Risk of fall 10 cm).  Ms Muenchow has history of enjoying exercise and would like to return to exercise and working in her future garden once she moves. Pt with DNF weakness and cannot tolerate longer than 7 sec ( normal 35 sec in supine DNF left in supine) Pt will benefit from skilled PT to address cervicalgia and balance deficits with possible vestibular problems.       OBJECTIVE IMPAIRMENTS decreased balance, decreased knowledge of use of DME, decreased ROM, decreased strength, dizziness, impaired UE functional use, improper body mechanics,  postural dysfunction, and pain.    ACTIVITY LIMITATIONS carrying, sleeping, reach over head, locomotion level, and drving   PARTICIPATION LIMITATIONS: driving and yard work   PERSONAL FACTORS Neck pain chronic, chronic R shld pain, postural kyphosis or cervicothoracic region, dysequilibrium, dizziness,  bipolar disorder. Incontinence managed with pelvic floor ex, GERD,  Scoliosis and increased lordosis are also affecting patient's functional outcome.    REHAB POTENTIAL: Good   CLINICAL DECISION MAKING: Evolving/moderate complexity   EVALUATION COMPLEXITY: Moderate     GOALS: Goals reviewed with patient? Yes   SHORT TERM  GOALS: Target date: 06/15/2022    Independent with initial HEP Baseline: limited knowledge Goal status: INITIAL   2.  Demonstrate understanding of proper sitting posture, body mechanics, work ergonomics, and be more conscious of position and posture throughout the day.  Baseline: unaware of posture throughout day Goal status: INITIAL   3.  Pt will increase AROM of cervical rotation by 25 % Baseline: See flow chart for AROM cervical Goal status: INITIAL       LONG TERM GOALS: Target date: 07/13/2022   Demonstrate and verbalize techniques to reduce the risk of re-injury including: lifting, posture, body mechanics.  Baseline: limited knowledge Goal status: INITIAL   2.  Pt will increase balance  in order to be able to stand SLS for 15 sec or more to show improvement of SLS at evaluation  Baseline: Eval R and L 3 sec or less Goal status: INITIAL   3. Pt headaches will be reduce to 3-4 times a week  Baseline: pt reports headaches several times a day Goal status: INITIAL   4.  Pt will be able to lift kitchen items to shelf overhead 15 # Baseline: Unable to lift items above head Goal status: INITIAL   5.  FOTO will improve from  44%  to  58%   indicating improved functional mobility .  Baseline: Eval 44% Goal status: INITIAL   6.  Pt will report  confidence in self management of condition at time of discharge with advanced HEP Baseline: Pt with no HEP for condition at present/eval Goal status: INITIAL   7. Pt will improve balance to 45/56 BERG from eval            Baseline: 49/56            Goal Status INITIAL   8. Pt will be able to drive and turn head with 97% greater ease            Baseline: Pt limited by pain and AROM  See flow chart            Goal Status INITIAL                 PLAN: PT FREQUENCY: 1-2x/week   PT DURATION: 8 weeks   PLANNED INTERVENTIONS: Therapeutic exercises, Therapeutic activity, Neuromuscular re-education, Balance training, Gait training, Patient/Family education, Self Care, Joint mobilization, Stair training, DME instructions, Dry Needling, Electrical stimulation, Cryotherapy, Moist heat, Taping, Traction, Ionotophoresis 4mg /ml Dexamethasone, Manual therapy, and Re-evaluation Vestibular   PLAN FOR NEXT SESSION: TPDN, Review HEP and progress as able, balance    , PT, ATRIC Certified Exercise Expert for the Aging Adult  06/08/22 1:06 PM Phone: [phone] Fax: [phone]   [phone], PT, ATRIC Certified Exercise Expert for the Aging Adult  11/23/22 2:47 PM Phone: [phone] Fax: [phone]

## 2022-06-08 NOTE — Patient Instructions (Signed)

## 2022-06-15 ENCOUNTER — Ambulatory Visit: Payer: BC Managed Care – PPO | Admitting: Physical Therapy

## 2022-06-22 ENCOUNTER — Telehealth: Payer: Self-pay | Admitting: Gastroenterology

## 2022-06-22 ENCOUNTER — Telehealth: Payer: Self-pay | Admitting: Family

## 2022-06-22 NOTE — Telephone Encounter (Signed)
Dr. Myrtie Neither recommended patient have an MRI done.  She wants to know where she can get it done at.  Please call patient and advise.  Thank you.

## 2022-06-22 NOTE — Telephone Encounter (Signed)
Pt called in request a called back about her referral for bone scan to be schedule in [facility] . Please Advise  [phone]

## 2022-06-22 NOTE — Telephone Encounter (Signed)
Returned call to patient. I informed pt that the order is still active and if she is ready to proceed we can have radiology scheduling reach out to her to set up her appt. Pt is aware that the order is placed for [facility] and she is OK with this. Pt knows to expect a call within the next few days to set up her appt. Pt verbalized understanding and had no concerns at the end of the call.  Secure staff message sent to radiology scheduling to set up appt.

## 2022-07-02 ENCOUNTER — Telehealth: Payer: Self-pay | Admitting: Gastroenterology

## 2022-07-02 NOTE — Telephone Encounter (Signed)
PT has MRI scheduled for 9/11 and needs sedation. She has switched her pharmacy to [facility] on [address]. Please advise. Thank you.

## 2022-07-06 ENCOUNTER — Ambulatory Visit: Payer: BC Managed Care – PPO | Admitting: Physical Therapy

## 2022-07-06 NOTE — Telephone Encounter (Signed)
Called and spoke with patient. She states that she needs an oral medication for her anxiety and claustrophobia. Pt is aware that if oral sedative is prescribed she will need a driver to take her to her MRI appt. Pt states that she has already made arrangements for that. Please advise, thanks.

## 2022-07-07 NOTE — Telephone Encounter (Signed)
I will send a prescription for valium (with instructions) today or tomorrow.  - HD

## 2022-07-07 NOTE — Telephone Encounter (Signed)
Left patient a detailed vm informing her that Dr. Myrtie Neither is going to send in a prescription for Valium to her [facility] pharmacy either today or tomorrow. Pt has been advised that her pharmacy should contact her once her prescription is ready. I advised pt to call back if she had any questions or concerns.

## 2022-07-07 NOTE — Telephone Encounter (Signed)
Patient called  to let you know the preferred pharmacy is [facility] on [address].

## 2022-07-07 NOTE — Telephone Encounter (Signed)
Pharmacy has been updated.

## 2022-07-08 MED ORDER — DIAZEPAM 5 MG PO TABS: 5.0000 mg | ORAL_TABLET | Freq: Once | ORAL | 0 refills | Status: AC

## 2022-07-08 NOTE — Addendum Note (Signed)
Addended by: Charlie Pitter on: 07/08/2022 01:09 PM   Modules accepted: Orders

## 2022-07-08 NOTE — Telephone Encounter (Signed)
Rx for valium with instructions sent  - HD

## 2022-07-12 ENCOUNTER — Ambulatory Visit (HOSPITAL_COMMUNITY)
Admission: RE | Admit: 2022-07-12 | Discharge: 2022-07-12 | Disposition: A | Discharge status: Home / Self Care | Payer: BC Managed Care – PPO | Source: Ambulatory Visit | Attending: Gastroenterology | Admitting: Gastroenterology

## 2022-07-12 DIAGNOSIS — K7689 Other specified diseases of liver: Secondary | ICD-10-CM | POA: Diagnosis present

## 2022-07-12 MED ORDER — GADOBUTROL 1 MMOL/ML IV SOLN
6.0000 mL | Freq: Once | INTRAVENOUS | Status: AC | PRN
  Administered 2022-07-12: 6 mL via INTRAVENOUS

## 2022-07-13 ENCOUNTER — Encounter: Payer: BC Managed Care – PPO | Admitting: Physical Therapy

## 2022-07-14 ENCOUNTER — Ambulatory Visit: Payer: BC Managed Care – PPO | Attending: Unknown Physician Specialty | Admitting: Physical Therapy

## 2022-07-14 ENCOUNTER — Encounter: Payer: Self-pay | Admitting: Physical Therapy

## 2022-07-14 DIAGNOSIS — R42 Dizziness and giddiness: Secondary | ICD-10-CM | POA: Diagnosis present

## 2022-07-14 DIAGNOSIS — R2681 Unsteadiness on feet: Secondary | ICD-10-CM | POA: Insufficient documentation

## 2022-07-14 DIAGNOSIS — R279 Unspecified lack of coordination: Secondary | ICD-10-CM | POA: Insufficient documentation

## 2022-07-14 DIAGNOSIS — R262 Difficulty in walking, not elsewhere classified: Secondary | ICD-10-CM | POA: Insufficient documentation

## 2022-07-14 NOTE — Therapy (Signed)
OUTPATIENT PHYSICAL THERAPY VESTIBULAR EVALUATION     Patient Name: Vicki Newman MRN: [id number] DOB:April 09, 1961, 61 y.o., female Today's Date: 07/14/2022  PCP: Mort Sawyers   REFERRING PROVIDER: Linus Salmons   PT End of Session - 07/14/22 1907     Visit Number 1    Date for PT Re-Evaluation 09/22/22    PT Start Time 1500    PT Stop Time 1540    PT Time Calculation (min) 40 min    Activity Tolerance Patient tolerated treatment well    Behavior During Therapy [facility] for tasks assessed/performed             Past Medical History:  Diagnosis Date   Allergy    Anxiety    Bipolar 1 disorder (HCC)    with mania   Bone spur    shoulder   Depression    Falls    Past Surgical History:  Procedure Laterality Date   HERNIA REPAIR  2000   OVARIAN CYST REMOVAL Bilateral    bil   Patient Active Problem List   Diagnosis Date Noted   Chronic right shoulder pain 03/30/2022   Postmenopausal 03/30/2022   Screening mammogram for breast cancer 03/30/2022   Hypercalcemia 03/30/2022   Bilateral impacted cerumen 03/16/2022   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   Bipolar 2 disorder, major depressive episode (HCC) 03/15/2022   Neck pain 03/15/2022   At risk for polypharmacy 03/15/2022   Dizziness and giddiness 03/15/2022   Dysequilibrium 03/15/2022   Vitamin D deficiency 03/15/2022   Stress incontinence 11/18/2021   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   Gastroesophageal reflux disease without esophagitis 11/18/2021   Allergic rhinitis due to animal hair and dander 11/18/2021    ONSET DATE: 06/16/2022   REFERRING DIAG: R42, Dizziness and Giddiness  THERAPY DIAG:  Dizziness and giddiness  Unsteadiness on feet  Lack of coordination  Difficulty in walking, not elsewhere classified  Rationale for Evaluation and Treatment Rehabilitation  SUBJECTIVE:   SUBJECTIVE STATEMENT: Patient reports 2 year H/O dizziness. Assessment completed by Neurology, which was  (-), ENT told her that "something is wrong with her L ear and she needs to go to therapy". She was also told polypharmacy may be contributing. She has been taken off Buspar, and they may be titrating her Prozac. Pt accompanied by: self  PERTINENT HISTORY: Neck pain chronic, chronic R shld pain, postural kyphosis or cervicothoracic region, dysequilibrium, dizziness,  bipolar disorder. Incontinence managed with pelvic floor ex, GERD,  Scoliosis and increased lordosis   PAIN:  Are you having pain? No  PRECAUTIONS: None  WEIGHT BEARING RESTRICTIONS No  FALLS: Has patient fallen in last 6 months? Yes. Number of falls 3  Last fall yesterday, she was tired, participating in exercise class, turned and lost her balance. She notes tat at least one time, she felt like she was spinning.  LIVING ENVIRONMENT: Lives with: lives with their family and lives with their spouse Lives in: House/apartment Stairs: Yes: Internal: 13 steps; on left going up and External: 4 steps; on left going up Has following equipment at home: None  PLOF: Independent  PATIENT GOALS Stop falling.  OBJECTIVE:   DIAGNOSTIC FINDINGS: EXAM: CERVICAL SPINE - COMPLETE 4+ VIEW   COMPARISON:  None Available.   FINDINGS: Straightening of normal lordosis. Trace anterolisthesis of C4 on C5. Disc space narrowing and spurring is most prominent at C5-C6 and C6-C7. Facet hypertrophy at C3-C4 and C4-C5 bilaterally. There is bony neural foraminal narrowing at C3-C4 and C4-C5 on the  right. No evidence of fracture, focal bone lesion or bone destruction. There is no prevertebral soft tissue thickening.   IMPRESSION: 1. Multilevel degenerative disc disease in the cervical spine, most prominent at C5-C6 and C6-C7. 2. Facet hypertrophy at C3-C4 and C4-C5 bilaterally. Bony neural foraminal narrowing at C3-C4 and C4-C5 on the right.  COGNITION: Overall cognitive status: Within functional limits for tasks assessed   SENSATION: Not  tested  EDEMA:  N/A  MUSCLE TONE: WFL   POSTURE: rounded shoulders   Cervical ROM:  Patient with known cervical degeneration and pain  Active A/PROM (deg) eval  Flexion 80%  Extension 60  Right lateral flexion 40  Left lateral flexion 40  Right rotation 80  Left rotation 80  (Blank rows = not tested)  STRENGTH: BLE WFL  BED MOBILITY:  I  TRANSFERS: I  GAIT: Gait pattern:  slow and cautious and WFL Distance walked: 52' Assistive device utilized: None Level of assistance: Complete Independence Comments: Patient walks a little slow and cautious, appears fearful of balance loss.  FUNCTIONAL TESTs:  5 x STS: TBD FGA: TBD   VESTIBULAR ASSESSMENT   GENERAL OBSERVATION: Slow movements    SYMPTOM BEHAVIOR:   Subjective history: 2 year H/O dizziness. She was told by ENT   Non-Vestibular symptoms: neck pain, arthitic   Type of dizziness: Imbalance (Disequilibrium) and Unsteady with head/body turns   Frequency: fluctuates   Duration: minutes   Aggravating factors: Induced by motion: bending down to the ground and turning head quickly   Relieving factors: no known relieving factors   Progression of symptoms: unchanged   OCULOMOTOR EXAM:   Ocular Alignment: normal   Ocular ROM: No Limitations   Spontaneous Nystagmus: TBD   Gaze-Induced Nystagmus: TBD   Smooth Pursuits: TBD   Saccades: TBD   Convergence/Divergence: TBD cm     VESTIBULAR - OCULAR REFLEX:    Slow VOR: TBD   VOR Cancellation: TBD   Head-Impulse Test: TBD   Dynamic Visual Acuity: TBD    POSITIONAL TESTING: Right Dix-Hallpike: no nystagmus Left Dix-Hallpike: Nystagmus noted, difficult to determine direction as her eyes moved constantly already, dizziness noted.    MOTION SENSITIVITY:    Motion Sensitivity Quotient  Intensity: 0 = none, 1 = Lightheaded, 2 = Mild, 3 = Moderate, 4 = Severe, 5 = Vomiting  Intensity  1. Sitting to supine 0  2. Supine to L side 0  3. Supine to R side 0  4.  Supine to sitting 0  5. L Hallpike-Dix 3  6. Up from L  3  7. R Hallpike-Dix 0  8. Up from R  0  9. Sitting, head  tipped to L knee 0  10. Head up from L  knee 0  11. Sitting, head  tipped to R knee   12. Head up from R  knee   13. Sitting head turns x5   14.Sitting head nods x5   15. In stance, 180  turn to L    16. In stance, 180  turn to R     OTHOSTATICS: not done   VESTIBULAR TREATMENT:  Canalith Repositioning:   Epley Left: Number of Reps: 1 and Response to Treatment: symptoms improved   PATIENT EDUCATION: Education details: POC, vestibular testing Person educated: Patient Education method: Explanation Education comprehension: verbalized understanding   GOALS: Goals reviewed with patient? Yes  SHORT TERM GOALS: Target date: 08/11/2022   I with basic HEP Baseline: Goal status: INITIAL   LONG TERM GOALS:  Target date: Patient will report resolution of dizziness x at least 1 week.09/22/2022    Increase FOTO score to at least 57 Baseline: 50 Goal status: INITIAL  2.  Patient will report no dizziness x at least 1 week. Baseline: Dizzy episodes every day or so. Goal status: INITIAL  3.  Complete 5 x STS in < 12 sec Baseline: TBD Goal status: INITIAL  4.  Patient will score at least 24 on FGA Baseline: TBD Goal status: INITIAL  ASSESSMENT:  CLINICAL IMPRESSION: Patient is a 61 y.o. who was seen today for physical therapy evaluation and treatment for Dizziness and giddiness. 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 Further vestibular assessment will be completed as well as balance testing on next visit.    OBJECTIVE IMPAIRMENTS Abnormal gait, decreased balance, decreased coordination, and dizziness.   ACTIVITY LIMITATIONS locomotion level  PARTICIPATION LIMITATIONS: cleaning, shopping, and community activity  PERSONAL FACTORS Past/current experiences are also affecting patient's functional outcome.   REHAB POTENTIAL:  Good  CLINICAL DECISION MAKING: Stable/uncomplicated  EVALUATION COMPLEXITY: Low   PLAN: PT FREQUENCY: 1-2x/week  PT DURATION: 10 weeks  PLANNED INTERVENTIONS: Therapeutic exercises, Therapeutic activity, Neuromuscular re-education, Balance training, Gait training, Patient/Family education, Self Care, Joint mobilization, Vestibular training, Canalith repositioning, and Visual/preceptual remediation/compensation  PLAN FOR NEXT SESSION: Complete vestibular and balance assessments   Iona Beard, DPT 07/14/2022, 7:09 PM

## 2022-07-15 ENCOUNTER — Encounter: Payer: Self-pay | Admitting: Family

## 2022-07-15 NOTE — Telephone Encounter (Signed)
There are some notes scanned in from 06/16/2022 from [facility] E[facility] Vestibular PT  Not sure if this is what she was talking about. I do not receive faxes/results from the referred office after the patients are seen, these should come directly to the PCP/CMA in [employer]. I do see something scanned in from [facility] though

## 2022-07-15 NOTE — Telephone Encounter (Signed)
Is this regarding a Bone Scan, like a bone density?   I attempted to contact patient, unable to reach to discuss.

## 2022-07-19 ENCOUNTER — Ambulatory Visit: Payer: BC Managed Care – PPO | Admitting: Physical Therapy

## 2022-07-19 ENCOUNTER — Encounter: Payer: Self-pay | Admitting: Physical Therapy

## 2022-07-19 DIAGNOSIS — R262 Difficulty in walking, not elsewhere classified: Secondary | ICD-10-CM

## 2022-07-19 DIAGNOSIS — R42 Dizziness and giddiness: Secondary | ICD-10-CM

## 2022-07-19 DIAGNOSIS — R2681 Unsteadiness on feet: Secondary | ICD-10-CM

## 2022-07-19 DIAGNOSIS — R279 Unspecified lack of coordination: Secondary | ICD-10-CM

## 2022-07-19 NOTE — Therapy (Signed)
OUTPATIENT PHYSICAL THERAPY VESTIBULAR EVALUATION     Patient Name: Vicki Newman MRN: [id number] DOB:27-Feb-1961, 61 y.o., female Today's Date: 07/19/2022  PCP: Eugenia Pancoast   REFERRING PROVIDER: Beverly Gust   PT End of Session - 07/19/22 1414     Visit Number 2    Date for PT Re-Evaluation 09/22/22    PT Start Time 1234    PT Stop Time 1312    PT Time Calculation (min) 38 min    Activity Tolerance Patient tolerated treatment well    Behavior During Therapy [facility] for tasks assessed/performed              Past Medical History:  Diagnosis Date   Allergy    Anxiety    Bipolar 1 disorder ([place])    with mania   Bone spur    shoulder   Depression    Falls    Past Surgical History:  Procedure Laterality Date   HERNIA REPAIR  2000   OVARIAN CYST REMOVAL Bilateral    bil   Patient Active Problem List   Diagnosis Date Noted   Chronic right shoulder pain 03/30/2022   Postmenopausal 03/30/2022   Screening mammogram for breast cancer 03/30/2022   Hypercalcemia 03/30/2022   Bilateral impacted cerumen 03/16/2022   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   Bipolar 2 disorder, major depressive episode ([place]) 03/15/2022   Neck pain 03/15/2022   At risk for polypharmacy 03/15/2022   Dizziness and giddiness 03/15/2022   Dysequilibrium 03/15/2022   Vitamin D deficiency 03/15/2022   Stress incontinence 11/18/2021   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   Gastroesophageal reflux disease without esophagitis 11/18/2021   Allergic rhinitis due to animal hair and dander 11/18/2021    ONSET DATE: 06/16/2022   REFERRING DIAG: R42, Dizziness and Giddiness  THERAPY DIAG:  Dizziness and giddiness  Unsteadiness on feet  Lack of coordination  Difficulty in walking, not elsewhere classified  Rationale for Evaluation and Treatment Rehabilitation  SUBJECTIVE:   SUBJECTIVE STATEMENT: Patient reports that after the evaluation, the dizziness spiked to 8. She was  spinning and she did feel some nausea. Slowly receded over the next day and a half. No dizziness.  PERTINENT HISTORY: Neck pain chronic, chronic R shld pain, postural kyphosis or cervicothoracic region, dysequilibrium, dizziness,  bipolar disorder. Incontinence managed with pelvic floor ex, GERD,  Scoliosis and increased lordosis   PAIN:  Are you having pain? No  PRECAUTIONS: None  WEIGHT BEARING RESTRICTIONS No  FALLS: Has patient fallen in last 6 months? Yes. Number of falls 3  Last fall yesterday, she was tired, participating in exercise class, turned and lost her balance. She notes tat at least one time, she felt like she was spinning.  LIVING ENVIRONMENT: Lives with: lives with their family and lives with their spouse Lives in: House/apartment Stairs: Yes: Internal: 13 steps; on left going up and External: 4 steps; on left going up Has following equipment at home: None  PLOF: Independent  PATIENT GOALS Stop falling.  OBJECTIVE:   DIAGNOSTIC FINDINGS: EXAM: CERVICAL SPINE - COMPLETE 4+ VIEW   COMPARISON:  None Available.   FINDINGS: Straightening of normal lordosis. Trace anterolisthesis of C4 on C5. Disc space narrowing and spurring is most prominent at C5-C6 and C6-C7. Facet hypertrophy at C3-C4 and C4-C5 bilaterally. There is bony neural foraminal narrowing at C3-C4 and C4-C5 on the right. No evidence of fracture, focal bone lesion or bone destruction. There is no prevertebral soft tissue thickening.   IMPRESSION: 1. Multilevel degenerative  disc disease in the cervical spine, most prominent at C5-C6 and C6-C7. 2. Facet hypertrophy at C3-C4 and C4-C5 bilaterally. Bony neural foraminal narrowing at C3-C4 and C4-C5 on the right.  COGNITION: Overall cognitive status: Within functional limits for tasks assessed   SENSATION: Not tested  EDEMA:  N/A  MUSCLE TONE: WFL   POSTURE: rounded shoulders   Cervical ROM:  Patient with known cervical degeneration and  pain  Active A/PROM (deg) eval  Flexion 80%  Extension 60  Right lateral flexion 40  Left lateral flexion 40  Right rotation 80  Left rotation 80  (Blank rows = not tested)  STRENGTH: BLE WFL  BED MOBILITY:  I  TRANSFERS: I  GAIT: Gait pattern:  slow and cautious and WFL Distance walked: 83' Assistive device utilized: None Level of assistance: Complete Independence Comments: Patient walks a little slow and cautious, appears fearful of balance loss.  FUNCTIONAL TESTs:  5 x STS: TBD FGA: TBD   VESTIBULAR ASSESSMENT   GENERAL OBSERVATION: Slow movements    SYMPTOM BEHAVIOR:   Subjective history: 2 year H/O dizziness. She was told by ENT   Non-Vestibular symptoms: neck pain, arthitic   Type of dizziness: Imbalance (Disequilibrium) and Unsteady with head/body turns   Frequency: fluctuates   Duration: minutes   Aggravating factors: Induced by motion: bending down to the ground and turning head quickly   Relieving factors: no known relieving factors   Progression of symptoms: unchanged   OCULOMOTOR EXAM:   Ocular Alignment: normal   Ocular ROM: No Limitations   07/19/22 Spontaneous Nystagmus: no   Gaze-Induced Nystagmus: no   Smooth Pursuits: L beat x1-2 when eyes moved L>R   Saccades: no   Convergence/Divergence: 10 cm    Cover/uncover test- R eye noted to compensate for 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    MOTION SENSITIVITY:    Motion Sensitivity Quotient  Intensity: 0 = none, 1 = Lightheaded, 2 = Mild, 3 = Moderate, 4 = Severe, 5 = Vomiting  Intensity  1. Sitting to supine 0  2. Supine to L side 0  3. Supine  to R side 0  4. Supine to sitting 0  5. L Hallpike-Dix 3  6. Up from L  3  7. R Hallpike-Dix 0  8. Up from R  0  9. Sitting, head  tipped to L knee 0  10. Head up from L  knee 0  11. Sitting, head  tipped to R knee   12. Head up from R  knee   13. Sitting head turns x5   14.Sitting head nods x5   15. In stance, 180  turn to L    16. In stance, 180  turn to R     OTHOSTATICS: not done   VESTIBULAR TREATMENT:  07/19/22 Additional vestibular testing performed, see objective section for results. Horizontal and Vert saccades Horizontal and Vertical tracking.  Canalith Repositioning:   Epley Left: Number of Reps: 1 and Response to Treatment: symptoms improved   PATIENT EDUCATION: Education details: POC, vestibular testing Person educated: Patient Education method: Explanation Education comprehension: verbalized understanding   GOALS: Goals reviewed with  patient? Yes  SHORT TERM GOALS: Target date: 08/11/2022   I with basic HEP Baseline: Goal status: ongoing   LONG TERM GOALS: Target date: Patient will report resolution of dizziness x at least 1 week.09/22/2022    Increase FOTO score to at least 57 Baseline: 50 Goal status: INITIAL  2.  Patient will report no dizziness x at least 1 week. Baseline: Dizzy episodes every day or so. Goal status: INITIAL  3.  Complete 5 x STS in < 12 sec Baseline: TBD Goal status: INITIAL  4.  Patient will score at least 24 on FGA Baseline: TBD Goal status: INITIAL  ASSESSMENT:  CLINICAL IMPRESSION: Patient reports increased dizziness after Epley maneuver. Further vestibular assessment performed. She demonstrates occular dysfunction. Initiated HEP for occular control.   OBJECTIVE IMPAIRMENTS Abnormal gait, decreased balance, decreased coordination, and dizziness.   ACTIVITY LIMITATIONS locomotion level  PARTICIPATION LIMITATIONS: cleaning, shopping, and community activity  PERSONAL FACTORS Past/current  experiences are also affecting patient's functional outcome.   REHAB POTENTIAL: Good  CLINICAL DECISION MAKING: Stable/uncomplicated  EVALUATION COMPLEXITY: Low  HEP  [id number]   PLAN: PT FREQUENCY: 1-2x/week  PT DURATION: 10 weeks  PLANNED INTERVENTIONS: Therapeutic exercises, Therapeutic activity, Neuromuscular re-education, Balance training, Gait training, Patient/Family education, Self Care, Joint mobilization, Vestibular training, Canalith repositioning, and Visual/preceptual remediation/compensation  PLAN FOR NEXT SESSION: Complete vestibular and balance assessments   Marcelina Morel, DPT 07/19/2022, 2:16 PM

## 2022-07-20 ENCOUNTER — Encounter: Payer: BC Managed Care – PPO | Admitting: Physical Therapy

## 2022-07-22 NOTE — Telephone Encounter (Signed)
This is very vague I am not sure what she is in reference to.  Please advise pt moving forward if a specialist or another office ordered something she needs to follow up with them and inquire from them, not primary.

## 2022-07-23 NOTE — Telephone Encounter (Signed)
Spoke to patient she needs the Bone Scan 3 phase changed to [facility].  Mammo and dex need to be changed to [place] [facility]. Once orders changed told patient we will send my chart message to let her know number to call to set up mammogram and dexa.

## 2022-07-23 NOTE — Telephone Encounter (Signed)
Called patient she has spoke to ENT and received results. She is now aware that all results come from the provider that ordered testing.  No further action needed at this time.

## 2022-07-26 NOTE — Telephone Encounter (Signed)
Ok to change all three referrals.  Please note needs BOTH three phase bone scan and bone density

## 2022-07-27 ENCOUNTER — Encounter: Payer: BC Managed Care – PPO | Admitting: Physical Therapy

## 2022-07-29 ENCOUNTER — Telehealth: Payer: Self-pay | Admitting: Family

## 2022-07-29 NOTE — Telephone Encounter (Signed)
Lft pt vm to call [phone] press option 3 and then 2 to sch . thanks 

## 2022-08-03 ENCOUNTER — Encounter: Payer: BC Managed Care – PPO | Admitting: Physical Therapy

## 2022-08-04 ENCOUNTER — Encounter: Payer: Self-pay | Admitting: Physical Therapy

## 2022-08-04 ENCOUNTER — Ambulatory Visit: Payer: BC Managed Care – PPO | Attending: Unknown Physician Specialty | Admitting: Physical Therapy

## 2022-08-04 DIAGNOSIS — R252 Cramp and spasm: Secondary | ICD-10-CM | POA: Insufficient documentation

## 2022-08-04 DIAGNOSIS — R269 Unspecified abnormalities of gait and mobility: Secondary | ICD-10-CM | POA: Insufficient documentation

## 2022-08-04 DIAGNOSIS — R279 Unspecified lack of coordination: Secondary | ICD-10-CM | POA: Diagnosis present

## 2022-08-04 DIAGNOSIS — M542 Cervicalgia: Secondary | ICD-10-CM | POA: Diagnosis present

## 2022-08-04 DIAGNOSIS — R42 Dizziness and giddiness: Secondary | ICD-10-CM | POA: Insufficient documentation

## 2022-08-04 DIAGNOSIS — R262 Difficulty in walking, not elsewhere classified: Secondary | ICD-10-CM | POA: Insufficient documentation

## 2022-08-04 DIAGNOSIS — R2681 Unsteadiness on feet: Secondary | ICD-10-CM | POA: Diagnosis present

## 2022-08-04 DIAGNOSIS — R293 Abnormal posture: Secondary | ICD-10-CM | POA: Diagnosis present

## 2022-08-04 DIAGNOSIS — M6281 Muscle weakness (generalized): Secondary | ICD-10-CM | POA: Diagnosis present

## 2022-08-04 NOTE — Therapy (Signed)
OUTPATIENT PHYSICAL THERAPY VESTIBULAR TREATMENT     Patient Name: Vicki Newman MRN: [id number] DOB:10-16-1961, 61 y.o., female Today's Date: 08/04/2022  PCP: Mort Sawyers   REFERRING PROVIDER: Linus Salmons   PT End of Session - 08/04/22 1254     Visit Number 3    Date for PT Re-Evaluation 09/22/22    PT Start Time 1110    PT Stop Time 1144    PT Time Calculation (min) 34 min    Activity Tolerance Patient tolerated treatment well    Behavior During Therapy [facility] for tasks assessed/performed               Past Medical History:  Diagnosis Date   Allergy    Anxiety    Bipolar 1 disorder (HCC)    with mania   Bone spur    shoulder   Depression    Falls    Past Surgical History:  Procedure Laterality Date   HERNIA REPAIR  2000   OVARIAN CYST REMOVAL Bilateral    bil   Patient Active Problem List   Diagnosis Date Noted   Chronic right shoulder pain 03/30/2022   Postmenopausal 03/30/2022   Screening mammogram for breast cancer 03/30/2022   Hypercalcemia 03/30/2022   Bilateral impacted cerumen 03/16/2022   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   Bipolar 2 disorder, major depressive episode (HCC) 03/15/2022   Neck pain 03/15/2022   At risk for polypharmacy 03/15/2022   Dizziness and giddiness 03/15/2022   Dysequilibrium 03/15/2022   Vitamin D deficiency 03/15/2022   Stress incontinence 11/18/2021   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   Gastroesophageal reflux disease without esophagitis 11/18/2021   Allergic rhinitis due to animal hair and dander 11/18/2021    ONSET DATE: 06/16/2022   REFERRING DIAG: R42, Dizziness and Giddiness  THERAPY DIAG:  Dizziness and giddiness  Unsteadiness on feet  Lack of coordination  Difficulty in walking, not elsewhere classified  Cervicalgia  Muscle weakness (generalized)  Abnormal posture  Cramp and spasm  Abnormality of gait and mobility  Rationale for Evaluation and Treatment  Rehabilitation  SUBJECTIVE:   SUBJECTIVE STATEMENT: Patient reports that her Dr increased her Lithium 2 weeks ago. 1 week ago, she started having dizziness and unsteadiness. She is going back to the Dr tomorrow. Today while driving in to her treatment she had an episode of nausea and vomiting in her car. She did not have any dizziness. She feels this may have been due to anxiety as her dr is titrating her Prozac. She was not pleased with her interactions from ENT and is seeking a second opinion  PERTINENT HISTORY: Neck pain chronic, chronic R shld pain, postural kyphosis or cervicothoracic region, dysequilibrium, dizziness,  bipolar disorder. Incontinence managed with pelvic floor ex, GERD,  Scoliosis and increased lordosis   PAIN:  Are you having pain? No  PRECAUTIONS: None  WEIGHT BEARING RESTRICTIONS No  FALLS: Has patient fallen in last 6 months? Yes. Number of falls 3  Last fall yesterday, she was tired, participating in exercise class, turned and lost her balance. She notes tat at least one time, she felt like she was spinning.  LIVING ENVIRONMENT: Lives with: lives with their family and lives with their spouse Lives in: House/apartment Stairs: Yes: Internal: 13 steps; on left going up and External: 4 steps; on left going up Has following equipment at home: None  PLOF: Independent  PATIENT GOALS Stop falling.  OBJECTIVE:   DIAGNOSTIC FINDINGS: EXAM: CERVICAL SPINE - COMPLETE 4+ VIEW  COMPARISON:  None Available.   FINDINGS: Straightening of normal lordosis. Trace anterolisthesis of C4 on C5. Disc space narrowing and spurring is most prominent at C5-C6 and C6-C7. Facet hypertrophy at C3-C4 and C4-C5 bilaterally. There is bony neural foraminal narrowing at C3-C4 and C4-C5 on the right. No evidence of fracture, focal bone lesion or bone destruction. There is no prevertebral soft tissue thickening.   IMPRESSION: 1. Multilevel degenerative disc disease in the cervical  spine, most prominent at C5-C6 and C6-C7. 2. Facet hypertrophy at C3-C4 and C4-C5 bilaterally. Bony neural foraminal narrowing at C3-C4 and C4-C5 on the right.  COGNITION: Overall cognitive status: Within functional limits for tasks assessed   SENSATION: Not tested  EDEMA:  N/A  MUSCLE TONE: WFL   POSTURE: rounded shoulders   Cervical ROM:  Patient with known cervical degeneration and pain  Active A/PROM (deg) eval  Flexion 80%  Extension 60  Right lateral flexion 40  Left lateral flexion 40  Right rotation 80  Left rotation 80  (Blank rows = not tested)  STRENGTH: BLE WFL  BED MOBILITY:  I  TRANSFERS: I  GAIT: Gait pattern:  slow and cautious and WFL Distance walked: 24' Assistive device utilized: None Level of assistance: Complete Independence Comments: Patient walks a little slow and cautious, appears fearful of balance loss.  FUNCTIONAL TESTs:  5 x STS: TBD FGA: TBD   VESTIBULAR ASSESSMENT   GENERAL OBSERVATION: Slow movements    SYMPTOM BEHAVIOR:   Subjective history: 2 year H/O dizziness. She was told by ENT   Non-Vestibular symptoms: neck pain, arthitic   Type of dizziness: Imbalance (Disequilibrium) and Unsteady with head/body turns   Frequency: fluctuates   Duration: minutes   Aggravating factors: Induced by motion: bending down to the ground and turning head quickly   Relieving factors: no known relieving factors   Progression of symptoms: unchanged   OCULOMOTOR EXAM:   Ocular Alignment: normal   Ocular ROM: No Limitations   07/19/22 Spontaneous Nystagmus: no   Gaze-Induced Nystagmus: no   Smooth Pursuits: L beat x1-2 when eyes moved L>R   Saccades: no   Convergence/Divergence: 10 cm    Cover/uncover test- R eye noted to compensate for L eye with has Ambliopia    VESTIBULAR - OCULAR REFLEX:    Slow VOR: (+) horiz-dizziness to 4, (+) vert-3   VOR Cancellation: (+), hor-3,    Head-Impulse Test: Deferred   Dynamic Visual Acuity:  (-)    POSITIONAL TESTING: Right Dix-Hallpike: no nystagmus Left Dix-Hallpike: Nystagmus noted, difficult to determine direction as her eyes moved constantly already, dizziness noted.    MOTION SENSITIVITY:    Motion Sensitivity Quotient  Intensity: 0 = none, 1 = Lightheaded, 2 = Mild, 3 = Moderate, 4 = Severe, 5 = Vomiting  Intensity  1. Sitting to supine 0  2. Supine to L side 0  3. Supine to R side 0  4. Supine to sitting 0  5. L Hallpike-Dix 3  6. Up from L  3  7. R Hallpike-Dix 0  8. Up from R  0  9. Sitting, head  tipped to L knee 0  10. Head up from L  knee 0  11. Sitting, head  tipped to R knee   12. Head up from R  knee   13. Sitting head turns x5   14.Sitting head nods x5   15. In stance, 180  turn to L    16. In stance, 180  turn to R  OTHOSTATICS: not done   VESTIBULAR TREATMENT: 08/04/22 Mal Amabile string-Patient only sees one string. Attempted closing 1 eye at a time, R eye definitely dominant. Eye chart- R eye 20/30, L eye 20/40 Balance assessment- EO- 30 EC- 28 EO soft surface-30 EC soft, trials 30, 30  07/19/22 Additional vestibular testing performed, see objective section for results. Horizontal and Vert saccades Horizontal and Vertical tracking.  Canalith Repositioning:   Epley Left: Number of Reps: 1 and Response to Treatment: symptoms improved   PATIENT EDUCATION: Education details: POC, vestibular testing Person educated: Patient Education method: Explanation Education comprehension: verbalized understanding   GOALS: Goals reviewed with patient? Yes  SHORT TERM GOALS: Target date: 08/11/2022   I with basic HEP Baseline: Goal status: ongoing   LONG TERM GOALS: Target date: Patient will report resolution of dizziness x at least 1 week.09/22/2022    Increase FOTO score to at least 57 Baseline: 50 Goal status: INITIAL  2.  Patient will report no dizziness x at least 1 week. Baseline: Dizzy episodes every day or so. Goal  status: ongoing  3.  Complete 5 x STS in < 12 sec Baseline: TBD Goal status: INITIAL  4.  Patient will score at least 24 on FGA Baseline: TBD Goal status: ongoing  ASSESSMENT:  CLINICAL IMPRESSION: Patient reports that the saccades are not a problem. She can complete 3 x 30 seconds in each direction. Encouraged her to increase time to 60 seconds. VOR causes mild dizziness and H/A. Encouraged to perform the VOR 2-3 times per day, but only to tolerance, stop if H/A grows. Explained the rationale of accomodation. Initiated CTSIB testing, but ran out of time due to patient arriving late after vomiting in her car.    OBJECTIVE IMPAIRMENTS Abnormal gait, decreased balance, decreased coordination, and dizziness.   ACTIVITY LIMITATIONS locomotion level  PARTICIPATION LIMITATIONS: cleaning, shopping, and community activity  PERSONAL FACTORS Past/current experiences are also affecting patient's functional outcome.   REHAB POTENTIAL: Good  CLINICAL DECISION MAKING: Stable/uncomplicated  EVALUATION COMPLEXITY: Low  HEP  [id number]   PLAN: PT FREQUENCY: 1-2x/week  PT DURATION: 10 weeks  PLANNED INTERVENTIONS: Therapeutic exercises, Therapeutic activity, Neuromuscular re-education, Balance training, Gait training, Patient/Family education, Self Care, Joint mobilization, Vestibular training, Canalith repositioning, and Visual/preceptual remediation/compensation  PLAN FOR NEXT SESSION: Complete vestibular and balance assessments   Iona Beard, DPT 08/04/2022, 12:56 PM

## 2022-08-05 ENCOUNTER — Encounter: Payer: BC Managed Care – PPO | Admitting: Physical Therapy

## 2022-08-06 ENCOUNTER — Ambulatory Visit: Payer: BC Managed Care – PPO | Admitting: Physical Therapy

## 2022-08-09 ENCOUNTER — Encounter: Payer: Self-pay | Admitting: Physical Therapy

## 2022-08-09 ENCOUNTER — Ambulatory Visit: Payer: BC Managed Care – PPO | Admitting: Physical Therapy

## 2022-08-09 DIAGNOSIS — R2681 Unsteadiness on feet: Secondary | ICD-10-CM

## 2022-08-09 DIAGNOSIS — R262 Difficulty in walking, not elsewhere classified: Secondary | ICD-10-CM

## 2022-08-09 DIAGNOSIS — R42 Dizziness and giddiness: Secondary | ICD-10-CM | POA: Diagnosis not present

## 2022-08-09 DIAGNOSIS — R279 Unspecified lack of coordination: Secondary | ICD-10-CM

## 2022-08-09 NOTE — Therapy (Signed)
OUTPATIENT PHYSICAL THERAPY VESTIBULAR TREATMENT     Patient Name: Vicki Newman MRN: [id number] DOB:[birth year], 61 y.o., female Today's Date: 08/09/2022  PCP: Eugenia Pancoast   REFERRING PROVIDER: Beverly Gust   PT End of Session - 08/09/22 1228     Visit Number 4    Date for PT Re-Evaluation 09/22/22    PT Start Time 1150    PT Stop Time 1222    PT Time Calculation (min) 32 min    Behavior During Therapy [facility] for tasks assessed/performed                Past Medical History:  Diagnosis Date   Allergy    Anxiety    Bipolar 1 disorder ([place])    with mania   Bone spur    shoulder   Depression    Falls    Past Surgical History:  Procedure Laterality Date   HERNIA REPAIR  2000   OVARIAN CYST REMOVAL Bilateral    bil   Patient Active Problem List   Diagnosis Date Noted   Chronic right shoulder pain 03/30/2022   Postmenopausal 03/30/2022   Screening mammogram for breast cancer 03/30/2022   Hypercalcemia 03/30/2022   Bilateral impacted cerumen 03/16/2022   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   Bipolar 2 disorder, major depressive episode ([place]) 03/15/2022   Neck pain 03/15/2022   At risk for polypharmacy 03/15/2022   Dizziness and giddiness 03/15/2022   Dysequilibrium 03/15/2022   Vitamin D deficiency 03/15/2022   Stress incontinence 11/18/2021   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   Gastroesophageal reflux disease without esophagitis 11/18/2021   Allergic rhinitis due to animal hair and dander 11/18/2021    ONSET DATE: 06/16/2022   REFERRING DIAG: R42, Dizziness and Giddiness  THERAPY DIAG:  Dizziness and giddiness  Unsteadiness on feet  Lack of coordination  Difficulty in walking, not elsewhere classified  Rationale for Evaluation and Treatment Rehabilitation  SUBJECTIVE:   SUBJECTIVE STATEMENT: Patient reports that her dizziness was 8/10 after her last treatment and lasted pretty much the rest of the day.  PERTINENT  HISTORY: Neck pain chronic, chronic R shld pain, postural kyphosis or cervicothoracic region, dysequilibrium, dizziness,  bipolar disorder. Incontinence managed with pelvic floor ex, GERD,  Scoliosis and increased lordosis   PAIN:  Are you having pain? No  PRECAUTIONS: None  WEIGHT BEARING RESTRICTIONS No  FALLS: Has patient fallen in last 6 months? Yes. Number of falls 3  Last fall yesterday, she was tired, participating in exercise class, turned and lost her balance. She notes tat at least one time, she felt like she was spinning.  LIVING ENVIRONMENT: Lives with: lives with their family and lives with their spouse Lives in: House/apartment Stairs: Yes: Internal: 13 steps; on left going up and External: 4 steps; on left going up Has following equipment at home: None  PLOF: Independent  PATIENT GOALS Stop falling.  OBJECTIVE:   DIAGNOSTIC FINDINGS: EXAM: CERVICAL SPINE - COMPLETE 4+ VIEW   COMPARISON:  None Available.   FINDINGS: Straightening of normal lordosis. Trace anterolisthesis of C4 on C5. Disc space narrowing and spurring is most prominent at C5-C6 and C6-C7. Facet hypertrophy at C3-C4 and C4-C5 bilaterally. There is bony neural foraminal narrowing at C3-C4 and C4-C5 on the right. No evidence of fracture, focal bone lesion or bone destruction. There is no prevertebral soft tissue thickening.   IMPRESSION: 1. Multilevel degenerative disc disease in the cervical spine, most prominent at C5-C6 and C6-C7. 2. Facet hypertrophy at C3-C4 and  C4-C5 bilaterally. Bony neural foraminal narrowing at C3-C4 and C4-C5 on the right.  COGNITION: Overall cognitive status: Within functional limits for tasks assessed   SENSATION: Not tested  EDEMA:  N/A  MUSCLE TONE: WFL   POSTURE: rounded shoulders   Cervical ROM:  Patient with known cervical degeneration and pain  Active A/PROM (deg) eval  Flexion 80%  Extension 60  Right lateral flexion 40  Left lateral  flexion 40  Right rotation 80  Left rotation 80  (Blank rows = not tested)  STRENGTH: BLE WFL  BED MOBILITY:  I  TRANSFERS: I  GAIT: Gait pattern:  slow and cautious and WFL Distance walked: 85' Assistive device utilized: None Level of assistance: Complete Independence Comments: Patient walks a little slow and cautious, appears fearful of balance loss.  FUNCTIONAL TESTs:  5 x STS: TBD FGA: TBD   VESTIBULAR ASSESSMENT   GENERAL OBSERVATION: Slow movements    SYMPTOM BEHAVIOR:   Subjective history: 2 year H/O dizziness. She was told by ENT   Non-Vestibular symptoms: neck pain, arthitic   Type of dizziness: Imbalance (Disequilibrium) and Unsteady with head/body turns   Frequency: fluctuates   Duration: minutes   Aggravating factors: Induced by motion: bending down to the ground and turning head quickly   Relieving factors: no known relieving factors   Progression of symptoms: unchanged   OCULOMOTOR EXAM:   Ocular Alignment: normal   Ocular ROM: No Limitations   07/19/22 Spontaneous Nystagmus: no   Gaze-Induced Nystagmus: no   Smooth Pursuits: L beat x1-2 when eyes moved L>R   Saccades: no   Convergence/Divergence: 10 cm    Cover/uncover test- R eye noted to compensate for 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    MOTION SENSITIVITY:    Motion Sensitivity Quotient  Intensity: 0 = none, 1 = Lightheaded, 2 = Mild, 3 = Moderate, 4 = Severe, 5 = Vomiting  Intensity  1. Sitting to supine 0  2. Supine to L side 0  3. Supine to R side 0  4. Supine to sitting 0  5. L Hallpike-Dix 3  6. Up from L  3  7. R Hallpike-Dix 0  8.  Up from R  0  9. Sitting, head  tipped to L knee 0  10. Head up from L  knee 0  11. Sitting, head  tipped to R knee   12. Head up from R  knee   13. Sitting head turns x5   14.Sitting head nods x5   15. In stance, 180  turn to L    16. In stance, 180  turn to R     OTHOSTATICS: not done   VESTIBULAR TREATMENT: 08/09/22 Patient education-See assessment  08/04/22 Mal Amabile string-Patient only sees one string. Attempted closing 1 eye at a time, R eye definitely dominant. Eye chart- R eye 20/30, L eye 20/40 Balance assessment- EO- 30, 30, 30 EC- 28, 27, 29 EO soft surface-30 EC soft, trials 30, 30, 30  07/19/22 Additional vestibular testing performed, see objective section for results. Horizontal and Vert saccades Horizontal and Vertical tracking.  Canalith Repositioning:   Epley Left: Number of Reps:  1 and Response to Treatment: symptoms improved   PATIENT EDUCATION: Education details: POC, vestibular testing Person educated: Patient Education method: Explanation Education comprehension: verbalized understanding   GOALS: Goals reviewed with patient? Yes  SHORT TERM GOALS: Target date: 08/11/2022   I with basic HEP Baseline: Goal status: ongoing   LONG TERM GOALS: Target date: Patient will report resolution of dizziness x at least 1 week.09/22/2022    Increase FOTO score to at least 57 Baseline: 50 Goal status: ongoing  2.  Patient will report no dizziness x at least 1 week. Baseline: Dizzy episodes every day or so. Goal status: ongoing  3.  Complete 5 x STS in < 12 sec Baseline: TBD Goal status: ongoing  4.  Patient will score at least 24 on FGA Baseline: TBD Goal status: ongoing  ASSESSMENT:  CLINICAL IMPRESSION: Patient reports that she was severely dizzy after out last appointment, lasted the rest of the day. MCTSIB was largely neg, but she did experience trouble with eyes closed on solid surface, lasting 28 sec vs 30 before LOB. Her H/O  amblyopia and the fact that working on her vision triggers dizziness lead therapist to believe that she would benefit from an opthalmologic assessment. She will continue to perform her vestibular exercises as tolerated at home while scheduling an appointment. Therapist to send over report of the assessments that were performed here. Cancel remaining PT appointments for the time being.   OBJECTIVE IMPAIRMENTS Abnormal gait, decreased balance, decreased coordination, and dizziness.   ACTIVITY LIMITATIONS locomotion level  PARTICIPATION LIMITATIONS: cleaning, shopping, and community activity  PERSONAL FACTORS Past/current experiences are also affecting patient's functional outcome.   REHAB POTENTIAL: Good  CLINICAL DECISION MAKING: Stable/uncomplicated  EVALUATION COMPLEXITY: Low  HEP  [id number]   PLAN: PT FREQUENCY: 1-2x/week  PT DURATION: 10 weeks  PLANNED INTERVENTIONS: Therapeutic exercises, Therapeutic activity, Neuromuscular re-education, Balance training, Gait training, Patient/Family education, Self Care, Joint mobilization, Vestibular training, Canalith repositioning, and Visual/preceptual remediation/compensation  PLAN FOR NEXT SESSION: Pt to schedule opthamologist appointment, hold therapy.   Iona Beard, DPT 08/09/2022, 12:29 PM

## 2022-08-11 ENCOUNTER — Ambulatory Visit: Payer: BC Managed Care – PPO | Admitting: Physical Therapy

## 2022-08-16 ENCOUNTER — Emergency Department (HOSPITAL_COMMUNITY)
Admission: EM | Admit: 2022-08-16 | Discharge: 2022-08-16 | Discharge status: Against Medical Advice | Payer: BC Managed Care – PPO | Attending: Emergency Medicine | Admitting: Emergency Medicine

## 2022-08-16 ENCOUNTER — Encounter (HOSPITAL_COMMUNITY): Payer: Self-pay | Admitting: Emergency Medicine

## 2022-08-16 ENCOUNTER — Other Ambulatory Visit: Payer: Self-pay

## 2022-08-16 ENCOUNTER — Ambulatory Visit: Payer: BC Managed Care – PPO | Admitting: Physical Therapy

## 2022-08-16 ENCOUNTER — Emergency Department (HOSPITAL_COMMUNITY): Payer: BC Managed Care – PPO

## 2022-08-16 DIAGNOSIS — Z5321 Procedure and treatment not carried out due to patient leaving prior to being seen by health care provider: Secondary | ICD-10-CM | POA: Diagnosis not present

## 2022-08-16 DIAGNOSIS — M542 Cervicalgia: Secondary | ICD-10-CM | POA: Diagnosis not present

## 2022-08-16 DIAGNOSIS — W06XXXA Fall from bed, initial encounter: Secondary | ICD-10-CM | POA: Insufficient documentation

## 2022-08-16 DIAGNOSIS — R519 Headache, unspecified: Secondary | ICD-10-CM | POA: Diagnosis present

## 2022-08-16 DIAGNOSIS — M25572 Pain in left ankle and joints of left foot: Secondary | ICD-10-CM | POA: Diagnosis not present

## 2022-08-16 NOTE — ED Triage Notes (Signed)
Patient here for evaluation after being startled in bed and then rolling out onto the floor, hitting her head on the floor and injuring her left pinky finger. Denies LOC, denies anticoagulation. Left pinky is painful and swollen.

## 2022-08-16 NOTE — ED Notes (Signed)
Pt states they are leaving, will follow up with PCP regarding imaging. Encouraged to stay, pt still leaving.

## 2022-08-16 NOTE — ED Provider Triage Note (Signed)
Emergency Medicine Provider Triage Evaluation Note  Vicki Newman , a 61 y.o. female  was evaluated in triage.  Pt complains of fall.  Patient states that this morning she went out of bed and fell onto her left pinky finger as well as striking her head on her bedside table.  Patient now complaining of headache, neck pain, left ankle pain.  Patient denies blood thinners, nausea, vomiting, lightheadedness.  Review of Systems  Positive:  Negative:   Physical Exam  BP 104/69 (BP Location: Right Arm)   Pulse (!) 58   Temp (!) 97.5 F (36.4 C) (Oral)   Resp 18   SpO2 98%  Gen:   Awake, no distress   Resp:  Normal effort  MSK:   Moves extremities without difficulty  Other:   Medical Decision Making  Medically screening exam initiated at 12:00 PM.  Appropriate orders placed.  Vicki Newman was informed that the remainder of the evaluation will be completed by another provider, this initial triage assessment does not replace that evaluation, and the importance of remaining in the ED until their evaluation is complete.     Azucena Cecil, PA-C 08/16/22 1200

## 2022-08-19 ENCOUNTER — Ambulatory Visit: Payer: BC Managed Care – PPO | Admitting: Physical Therapy

## 2022-08-19 DIAGNOSIS — H539 Unspecified visual disturbance: Secondary | ICD-10-CM | POA: Insufficient documentation

## 2022-08-24 NOTE — Telephone Encounter (Signed)
Patient would like mammogram done at [facility] ,she would like the order sent there.

## 2022-08-25 ENCOUNTER — Ambulatory Visit: Payer: BC Managed Care – PPO | Admitting: Physical Therapy

## 2022-08-27 NOTE — Telephone Encounter (Signed)
I dont handle routine Mammograms and Bone densities. Those have to be printed and faxed to the location unless they have been ordered internally. If Tabitha placed these orders for [facility] location then the patient would call [phone] to schedule her appt.   When looking at the appt desk, there are notes attached to the orders (in the order tab of her appt desk) where the location reached out to the patient to schedule. It looks like these orders are from about 5-19mo ago??

## 2022-08-30 NOTE — Telephone Encounter (Signed)
Called and spoke to pt she has already scheduled the appt.

## 2022-08-31 ENCOUNTER — Telehealth: Payer: Self-pay | Admitting: Family

## 2022-08-31 ENCOUNTER — Encounter: Payer: Self-pay | Admitting: Family

## 2022-08-31 DIAGNOSIS — K219 Gastro-esophageal reflux disease without esophagitis: Secondary | ICD-10-CM

## 2022-08-31 DIAGNOSIS — J45990 Exercise induced bronchospasm: Secondary | ICD-10-CM

## 2022-08-31 NOTE — Telephone Encounter (Signed)
Patient called in returning a call to [facility]. She stated her covid test was negative so she will cancel the virtual visit tomorrow and keep the one for next week. Thank you!

## 2022-08-31 NOTE — Telephone Encounter (Signed)
Noted  

## 2022-09-01 ENCOUNTER — Telehealth: Payer: BC Managed Care – PPO | Admitting: Family

## 2022-09-02 ENCOUNTER — Ambulatory Visit: Payer: BC Managed Care – PPO | Admitting: Family

## 2022-09-03 ENCOUNTER — Other Ambulatory Visit (HOSPITAL_COMMUNITY): Payer: BC Managed Care – PPO

## 2022-09-03 ENCOUNTER — Encounter (HOSPITAL_COMMUNITY)
Admission: RE | Admit: 2022-09-03 | Discharge: 2022-09-03 | Disposition: A | Discharge status: Home / Self Care | Payer: 59 | Source: Ambulatory Visit | Attending: Family | Admitting: Family

## 2022-09-03 ENCOUNTER — Ambulatory Visit (HOSPITAL_COMMUNITY): Payer: BC Managed Care – PPO

## 2022-09-03 ENCOUNTER — Encounter (HOSPITAL_COMMUNITY): Payer: Self-pay

## 2022-09-03 ENCOUNTER — Encounter (HOSPITAL_COMMUNITY): Payer: 59

## 2022-09-03 DIAGNOSIS — M542 Cervicalgia: Secondary | ICD-10-CM | POA: Diagnosis present

## 2022-09-03 MED ORDER — TECHNETIUM TC 99M MEDRONATE IV KIT
20.0000 | PACK | Freq: Once | INTRAVENOUS | Status: AC | PRN
  Administered 2022-09-03: 21.8 via INTRAVENOUS

## 2022-09-06 ENCOUNTER — Ambulatory Visit (INDEPENDENT_AMBULATORY_CARE_PROVIDER_SITE_OTHER): Payer: 59 | Admitting: Family

## 2022-09-06 ENCOUNTER — Encounter: Payer: Self-pay | Admitting: Family

## 2022-09-06 VITALS — BP 112/68 | HR 62 | Temp 96.8°F | Resp 16 | Ht 65.0 in | Wt 136.1 lb

## 2022-09-06 DIAGNOSIS — F338 Other recurrent depressive disorders: Secondary | ICD-10-CM | POA: Diagnosis not present

## 2022-09-06 DIAGNOSIS — R7989 Other specified abnormal findings of blood chemistry: Secondary | ICD-10-CM

## 2022-09-06 DIAGNOSIS — J020 Streptococcal pharyngitis: Secondary | ICD-10-CM

## 2022-09-06 DIAGNOSIS — J029 Acute pharyngitis, unspecified: Secondary | ICD-10-CM | POA: Diagnosis not present

## 2022-09-06 DIAGNOSIS — R42 Dizziness and giddiness: Secondary | ICD-10-CM

## 2022-09-06 LAB — HEPATIC FUNCTION PANEL
ALT: 19 U/L (ref 0–35)
AST: 16 U/L (ref 0–37)
Albumin: 4.4 g/dL (ref 3.5–5.2)
Alkaline Phosphatase: 117 U/L (ref 39–117)
Bilirubin, Direct: 0.1 mg/dL (ref 0.0–0.3)
Total Bilirubin: 0.4 mg/dL (ref 0.2–1.2)
Total Protein: 6.5 g/dL (ref 6.0–8.3)

## 2022-09-06 LAB — POCT RAPID STREP A (OFFICE): Rapid Strep A Screen: POSITIVE — AB

## 2022-09-06 MED ORDER — AMOXICILLIN-POT CLAVULANATE 875-125 MG PO TABS: 1.0000 | ORAL_TABLET | Freq: Two times a day (BID) | ORAL | 0 refills | Status: DC

## 2022-09-06 NOTE — Progress Notes (Unsigned)
Established Patient Office Visit  Subjective:  Patient ID: Vicki Newman, female    DOB: 1961-02-08  Age: 61 y.o. MRN: [id number]  CC:  Chief Complaint  Patient presents with   Follow-up    HPI Vicki Newman is here today for follow up.   Went to Er 10/16 for fall. She was getting out of bed and fell onto her left pinky finger and struck her head on the bedside table. Went to ER bc c/o headache, neck pain, left ankle pain.   Xray left hand: negative for fracture or dislocation  Ct head: no acute intracranial abn  Ct cervical spine: no acute abn   Discharge same day.   Does admit to increased stress and anxiety When she takes a deep breath she has to cough. Over the last week feels like she might have a sinus infection as she has sinus pressure. Cough is nonproductive. Feels like she has 'junk' in her lungs. She does have some doe.  She has been without her albuterol, and increasing cardio over  the last one month and she is getting more sob more often. She had covid over a year ago and feels lung have not been the same since.   Recently seen by psychiatry, she is currently tapering/weaning of fof her prozac 40 mg as well as off of buspar 10 mg. Also has d/c off lithium 450 mg nightly. She states next they will work on tapering off of lamictal. Has f/u with her tomorrow. She does use vistrail as needed. They are discussing whether she actually has bipolar or if it was extreme anxiety at the time.   Vitamin d def: does state she is taking 5000 Vitamin D daily. Should be taking more of 2000 Iu once daily.   Ongoing dizziness, feels her head is spinning inside of her head. She has seen neurology in the past for this. Was recently seen by ENT at [facility], negative for ENT type related causes, and was referred to neurophthalmology , and she has appt pending.   Past Medical History:  Diagnosis Date   Allergy    Anxiety    Bipolar 1 disorder ([place])    with mania   Bone spur     shoulder   Depression    Falls     Past Surgical History:  Procedure Laterality Date   HERNIA REPAIR  2000   OVARIAN CYST REMOVAL Bilateral    bil    Family History  Problem Relation Age of Onset   Hypertension Mother    Hearing loss Mother    Depression Mother    Cancer Mother    Arthritis Mother    Alcohol abuse Mother    Mental illness Mother    Mental illness Father    Hearing loss Father    Depression Father    Arthritis Father    Alcohol abuse Father    Hyperlipidemia Brother    Depression Brother    Alcohol abuse Brother    Mental illness Brother    Depression Brother    Cancer Maternal Grandmother    COPD Paternal Grandfather     Social History   Socioeconomic History   Marital status: Married    Spouse name: Not on file   Number of children: 3   Years of education: Not on file   Highest education level: Not on file  Occupational History   Occupation: retired  Tobacco Use   Smoking status: Never   Smokeless tobacco: Never  Vaping Use   Vaping Use: Never used  Substance and Sexual Activity   Alcohol use: Never   Drug use: Never   Sexual activity: Yes    Partners: Male    Birth control/protection: Post-menopausal  Other Topics Concern   Not on file  Social History Narrative   Three girls   Retired Sports coach   Social Determinants of Radio broadcast assistant Strain: Not on file  Food Insecurity: Not on file  Transportation Needs: Not on file  Physical Activity: Not on file  Stress: Not on file  Social Connections: Not on file  Intimate Partner Violence: Not on file    Outpatient Medications Prior to Visit  Medication Sig Dispense Refill   B Complex-C (B-COMPLEX WITH VITAMIN C) tablet Take 1 tablet by mouth daily.     Cholecalciferol 25 MCG (1000 UT) tablet Take by mouth.     hydrOXYzine (VISTARIL) 25 MG capsule Take 25 mg by mouth at bedtime.     lamoTRIgine (LAMICTAL) 150 MG tablet Take 1 tablet (150 mg total) by mouth 2  (two) times daily. 180 tablet 1   loratadine (CLARITIN) 10 MG tablet Take 10 mg by mouth daily.     omeprazole (PRILOSEC) 20 MG capsule Take 1 capsule (20 mg total) by mouth daily. 90 capsule 1   B Complex-C (B-COMPLEX WITH VITAMIN C) tablet Take by mouth every other day.     busPIRone (BUSPAR) 10 MG tablet Take 15 mg by mouth 2 (two) times daily.     FLUoxetine (PROZAC) 10 MG capsule Take 10 mg by mouth daily. Take with 40 mg     FLUoxetine (PROZAC) 40 MG capsule Take 1 capsule (40 mg total) by mouth daily. (Patient taking differently: Take 40 mg by mouth daily. Take with 10 mg) 90 capsule 3   lithium carbonate (ESKALITH) 450 MG CR tablet Take 1 tablet (450 mg total) by mouth at bedtime. 90 tablet 1   Magnesium 200 MG CHEW Chew 200 mg by mouth 2 (two) times daily. (Patient not taking: Reported on 05/18/2022)     No facility-administered medications prior to visit.    Allergies  Allergen Reactions   Sulfa Antibiotics Rash   Valacyclovir     Other reaction(s): Dizziness (intolerance)         Objective:    Physical Exam HENT:     Nose: Mucosal edema and congestion present.     Right Sinus: Maxillary sinus tenderness and frontal sinus tenderness present.     Left Sinus: Maxillary sinus tenderness and frontal sinus tenderness present.     Mouth/Throat:     Pharynx: Posterior oropharyngeal erythema (mild) present.     Tonsils: No tonsillar exudate or tonsillar abscesses. 1+ on the right. 1+ on the left.  Lymphadenopathy:     Head:     Right side of head: Tonsillar adenopathy present.     Left side of head: Tonsillar adenopathy present.      BP 112/68   Pulse 62   Temp (!) 96.8 F (36 C) (Temporal)   Resp 16   Ht _0  (1.651 m)   Wt 136 lb 2 oz (61.7 kg)   SpO2 94%   BMI 22.65 kg/m  Wt Readings from Last 3 Encounters:  09/06/22 136 lb 2 oz (61.7 kg)  03/30/22 133 lb 7 oz (60.5 kg)  03/15/22 133 lb 9 oz (60.6 kg)     Health Maintenance Due  Topic Date Due    Zoster  Vaccines- Shingrix (1 of 2) Never done   PAP SMEAR-Modifier  Never done   COVID-19 Vaccine (3 - Moderna risk series) 03/07/2020    There are no preventive care reminders to display for this patient.  Lab Results  Component Value Date   TSH 2.31 12/17/2021   Lab Results  Component Value Date   WBC 6.4 03/15/2022   HGB 14.3 03/15/2022   HCT 43.5 03/15/2022   MCV 93.8 03/15/2022   PLT 366.0 03/15/2022   Lab Results  Component Value Date   NA 142 03/15/2022   K 4.6 03/15/2022   CO2 27 03/15/2022   GLUCOSE 84 03/15/2022   BUN 23 03/15/2022   CREATININE 1.07 03/15/2022   BILITOT 0.4 09/06/2022   ALKPHOS 117 09/06/2022   AST 16 09/06/2022   ALT 19 09/06/2022   PROT 6.5 09/06/2022   ALBUMIN 4.4 09/06/2022   CALCIUM 9.9 09/06/2022   EGFR 54 (L) 09/02/2021   GFR 56.53 (L) 03/15/2022   No results found for: "CHOL" No results found for: "HDL" No results found for: "LDLCALC" No results found for: "TRIG" No results found for: "CHOLHDL" No results found for: "HGBA1C"    Assessment & Plan:   Problem List Items Addressed This Visit       Respiratory   Strep pharyngitis    Strep tested positive in office.  rx augmentin 875/125 mg po bid x 10 days Ibuprofen/tyelnol prn sore throat/fever Pt told to F/u if no improvement in the next 2-3 days.       Relevant Medications   amoxicillin-clavulanate (AUGMENTIN) 875-125 MG tablet     Other   Dizziness and giddiness    Continue f/u with neurophthalmology as scheduled.       Elevated parathyroid hormone    Repeat today as 5/23 was normal. Pending results      Relevant Orders   PTH, Intact and Calcium (Completed)   Seasonal affective disorder (HCC)   Elevated liver function tests    Repeat lft today pending results      Relevant Orders   Hepatic Function Panel (Completed)   RESOLVED: Sore throat - Primary    Strep tested positive in office.  rx augmentin 875/125 mg po bid x 10 days  Ibuprofen/tyelnol prn  sore throat/fever Pt told to F/u if no improvement in the next 2-3 days.       Relevant Orders   POCT rapid strep A (Completed)    Meds ordered this encounter  Medications   amoxicillin-clavulanate (AUGMENTIN) 875-125 MG tablet    Sig: Take 1 tablet by mouth 2 (two) times daily.    Dispense:  20 tablet    Refill:  0    Order Specific Question:   Supervising Provider    Answer:   Diona Browner, AMY E [[id number]]    Follow-up: Return in about 3 months (around 12/07/2022) for f/u dizziness.    Eugenia Pancoast, FNP

## 2022-09-06 NOTE — Assessment & Plan Note (Signed)
Continue f/u with neurophthalmology as scheduled.

## 2022-09-06 NOTE — Assessment & Plan Note (Signed)
Repeat lft today pending results

## 2022-09-06 NOTE — Assessment & Plan Note (Signed)
Repeat today as 5/23 was normal. Pending results

## 2022-09-06 NOTE — Patient Instructions (Signed)
  You were found to be strep positive,  Take antibiotics that have been sent to the pharmacy.  Change your toothbrush after 24 hours on the antibiotics.  Gargle with warm salt water as needed for sore throat.    Regards,   Shuntell Foody FNP-C   

## 2022-09-06 NOTE — Assessment & Plan Note (Signed)
Strep tested positive in office.  rx augmentin 875/125 mg po bid x 10 days Ibuprofen/tyelnol prn sore throat/fever Pt told to F/u if no improvement in the next 2-3 days.  

## 2022-09-07 LAB — PTH, INTACT AND CALCIUM
Calcium: 9.9 mg/dL (ref 8.6–10.4)
PTH: 53 pg/mL (ref 16–77)

## 2022-09-07 NOTE — Assessment & Plan Note (Signed)
Strep tested positive in office.  rx augmentin 875/125 mg po bid x 10 days Ibuprofen/tyelnol prn sore throat/fever Pt told to F/u if no improvement in the next 2-3 days.  

## 2022-09-10 MED ORDER — OMEPRAZOLE 20 MG PO CPDR: 20.0000 mg | DELAYED_RELEASE_CAPSULE | Freq: Every day | ORAL | 1 refills | Status: DC

## 2022-09-10 MED ORDER — LORATADINE 10 MG PO TABS: 10.0000 mg | ORAL_TABLET | Freq: Every day | ORAL | 3 refills | Status: DC

## 2022-09-13 DIAGNOSIS — J45990 Exercise induced bronchospasm: Secondary | ICD-10-CM | POA: Insufficient documentation

## 2022-09-13 MED ORDER — ALBUTEROL SULFATE HFA 108 (90 BASE) MCG/ACT IN AERS: 2.0000 | INHALATION_SPRAY | Freq: Four times a day (QID) | RESPIRATORY_TRACT | 0 refills | Status: AC | PRN

## 2022-09-14 ENCOUNTER — Telehealth: Payer: Self-pay | Admitting: Physical Therapy

## 2022-09-14 NOTE — Telephone Encounter (Signed)
LVM that Vicki Newman is out of POC.  Pt was last seen at Neuro for vestibular and has not returned to [facility].  Pt chart reveals a trip to the ED and due to POC expired and visit to ED would need new referral. Pt was asked to contact Garen Lah PT if she would like to continue PT at [facility].   Garen Lah, PT, ATRIC Certified Exercise Expert for the Aging Adult  09/14/22 2:39 PM Phone: [phone] Fax: [phone]

## 2022-09-26 ENCOUNTER — Encounter: Payer: Self-pay | Admitting: Physical Therapy

## 2022-10-05 DIAGNOSIS — H5000 Unspecified esotropia: Secondary | ICD-10-CM | POA: Insufficient documentation

## 2022-10-05 DIAGNOSIS — H5203 Hypermetropia, bilateral: Secondary | ICD-10-CM | POA: Insufficient documentation

## 2022-10-15 DIAGNOSIS — H5005 Alternating esotropia: Secondary | ICD-10-CM | POA: Insufficient documentation

## 2022-11-03 ENCOUNTER — Telehealth: Payer: Self-pay | Admitting: Family

## 2022-11-03 NOTE — Telephone Encounter (Signed)
Pt stated she was suppose to get a referral for a mammogram sometime last year & she never received any calls following up on the referral. Call back # [phone]

## 2022-11-03 NOTE — Telephone Encounter (Signed)
Called patient states she would like to have order changed to [facility]

## 2022-11-04 ENCOUNTER — Ambulatory Visit: Payer: 59 | Admitting: Family

## 2022-11-04 ENCOUNTER — Encounter: Payer: Self-pay | Admitting: Family

## 2022-11-04 ENCOUNTER — Ambulatory Visit (INDEPENDENT_AMBULATORY_CARE_PROVIDER_SITE_OTHER)
Admission: RE | Admit: 2022-11-04 | Discharge: 2022-11-04 | Disposition: A | Discharge status: Home / Self Care | Payer: 59 | Source: Ambulatory Visit | Attending: Family | Admitting: Family

## 2022-11-04 VITALS — BP 108/78 | HR 84 | Ht 65.0 in | Wt 136.0 lb

## 2022-11-04 DIAGNOSIS — Z1231 Encounter for screening mammogram for malignant neoplasm of breast: Secondary | ICD-10-CM

## 2022-11-04 DIAGNOSIS — S6992XA Unspecified injury of left wrist, hand and finger(s), initial encounter: Secondary | ICD-10-CM

## 2022-11-04 NOTE — Progress Notes (Signed)
Established Patient Office Visit  Subjective:  Patient ID: Vicki Newman, female    DOB: [birth year]  Age: 62 y.o. MRN: [id number]  CC:  Chief Complaint  Patient presents with   Finger Injury    Left pinky, Oct 2023, told no fx, trouble with movement/swelling/pain/redness    HPI Vicki Newman is here today for follow up.   Pt is with acute concerns.  Finger injury, fell back in October. Xray she states stated no fracture however still with red spot on joint, still painful and limited range of motion. Pain is constant and pain is currently 0/10 with movement 4/5 out of ten. Does take ibuprofen/tylenol with mild relief.  Worse in certain positions.   Mood disorder, depression anxiety: started on pristiq 25 mg once daily , attempted to restart lithium however made her feel dizzy. They are also going to try to wean her off of lamictal.  Sees psychiatrist Dr. Oletta Lamas.   Mammogram: needs order sent to [facility].   Past Medical History:  Diagnosis Date   Allergy    Anxiety    Bipolar 1 disorder ([place])    with mania   Bone spur    shoulder   Depression    Falls     Past Surgical History:  Procedure Laterality Date   HERNIA REPAIR  2000   OVARIAN CYST REMOVAL Bilateral    bil    Family History  Problem Relation Age of Onset   Hypertension Mother    Hearing loss Mother    Depression Mother    Cancer Mother    Arthritis Mother    Alcohol abuse Mother    Mental illness Mother    Mental illness Father    Hearing loss Father    Depression Father    Arthritis Father    Alcohol abuse Father    Hyperlipidemia Brother    Depression Brother    Alcohol abuse Brother    Mental illness Brother    Depression Brother    Cancer Maternal Grandmother    COPD Paternal Grandfather     Social History   Socioeconomic History   Marital status: Married    Spouse name: Not on file   Number of children: 3   Years of education: Not on file   Highest education  level: Not on file  Occupational History   Occupation: retired  Tobacco Use   Smoking status: Never   Smokeless tobacco: Never  Vaping Use   Vaping Use: Never used  Substance and Sexual Activity   Alcohol use: Never   Drug use: Never   Sexual activity: Yes    Partners: Male    Birth control/protection: Post-menopausal  Other Topics Concern   Not on file  Social History Narrative   Three girls   Retired Sports coach   Social Determinants of Radio broadcast assistant Strain: Not on file  Food Insecurity: Not on file  Transportation Needs: Not on file  Physical Activity: Not on file  Stress: Not on file  Social Connections: Not on file  Intimate Partner Violence: Not on file    Outpatient Medications Prior to Visit  Medication Sig Dispense Refill   albuterol (VENTOLIN HFA) 108 (90 Base) MCG/ACT inhaler Inhale 2 puffs into the lungs every 6 (six) hours as needed for wheezing or shortness of breath. 8 g 0   B Complex-C (B-COMPLEX WITH VITAMIN C) tablet Take 1 tablet by mouth daily.     Cholecalciferol 25 MCG (1000 UT)  tablet Take by mouth.     lamoTRIgine (LAMICTAL) 100 MG tablet Take 100 mg by mouth 2 (two) times daily.     loratadine (CLARITIN) 10 MG tablet Take 1 tablet (10 mg total) by mouth daily. 90 tablet 3   LORazepam (ATIVAN) 0.5 MG tablet Take 0.5 mg by mouth every 8 (eight) hours as needed for anxiety or sleep.     omeprazole (PRILOSEC) 20 MG capsule Take 1 capsule (20 mg total) by mouth daily. 90 capsule 1   amoxicillin-clavulanate (AUGMENTIN) 875-125 MG tablet Take 1 tablet by mouth 2 (two) times daily. 20 tablet 0   hydrOXYzine (VISTARIL) 25 MG capsule Take 25 mg by mouth at bedtime.     lamoTRIgine (LAMICTAL) 150 MG tablet Take 1 tablet (150 mg total) by mouth 2 (two) times daily. 180 tablet 1   No facility-administered medications prior to visit.    Allergies  Allergen Reactions   Sulfa Antibiotics Rash   Valacyclovir     Other reaction(s): Dizziness  (intolerance)        Objective:    Physical Exam Constitutional:      Appearance: Normal appearance. She is normal weight.  Pulmonary:     Effort: Pulmonary effort is normal.  Musculoskeletal:     Left hand: Swelling (middle phalynx with ecchymosis) present. Deformity: left 5th metatarsal noted with swelling and decreased ROM.Decreased range of motion (with painful ROM). Normal sensation. Normal capillary refill.  Neurological:     General: No focal deficit present.     Mental Status: She is alert and oriented to person, place, and time. Mental status is at baseline.  Psychiatric:        Mood and Affect: Mood normal.        Behavior: Behavior normal.        Thought Content: Thought content normal.        Judgment: Judgment normal.      BP 108/78   Pulse 84   Ht _0  (1.651 m)   Wt 136 lb (61.7 kg)   SpO2 98%   BMI 22.63 kg/m  Wt Readings from Last 3 Encounters:  11/04/22 136 lb (61.7 kg)  09/06/22 136 lb 2 oz (61.7 kg)  03/30/22 133 lb 7 oz (60.5 kg)     Health Maintenance Due  Topic Date Due   DTaP/Tdap/Td (1 - Tdap) Never done   Zoster Vaccines- Shingrix (1 of 2) Never done   PAP SMEAR-Modifier  Never done   COVID-19 Vaccine (3 - Moderna risk series) 03/07/2020    There are no preventive care reminders to display for this patient.  Lab Results  Component Value Date   TSH 2.31 12/17/2021   Lab Results  Component Value Date   WBC 6.4 03/15/2022   HGB 14.3 03/15/2022   HCT 43.5 03/15/2022   MCV 93.8 03/15/2022   PLT 366.0 03/15/2022   Lab Results  Component Value Date   NA 142 03/15/2022   K 4.6 03/15/2022   CO2 27 03/15/2022   GLUCOSE 84 03/15/2022   BUN 23 03/15/2022   CREATININE 1.07 03/15/2022   BILITOT 0.4 09/06/2022   ALKPHOS 117 09/06/2022   AST 16 09/06/2022   ALT 19 09/06/2022   PROT 6.5 09/06/2022   ALBUMIN 4.4 09/06/2022   CALCIUM 9.9 09/06/2022   EGFR 54 (L) 09/02/2021   GFR 56.53 (L) 03/15/2022   No results found for:  "CHOL" No results found for: "HDL" No results found for: "LDLCALC" No results found for: "  TRIG" No results found for: "CHOLHDL" No results found for: "HGBA1C"    Assessment & Plan:   Problem List Items Addressed This Visit       Other   Finger injury, left, initial encounter    Finger xray, pending results.  Recommend over the counter voltaren gel as well.       Relevant Orders   DG Hand Complete Left   RESOLVED: Encounter for screening mammogram for malignant neoplasm of breast - Primary   Relevant Orders   MM 3D SCREEN BREAST BILATERAL    No orders of the defined types were placed in this encounter.   Follow-up: Return in about 3 months (around 02/03/2023) for f/u CPE.    Eugenia Pancoast, FNP

## 2022-11-04 NOTE — Patient Instructions (Signed)
  Recommend over the counter voltaren (diflofenac) gel. This will help with the finger.   Complete xray(s) prior to leaving today. I will notify you of your results once received.   Regards,   Eugenia Pancoast FNP-C

## 2022-11-04 NOTE — Assessment & Plan Note (Signed)
Finger xray, pending results.  Recommend over the counter voltaren gel as well.

## 2022-11-05 NOTE — Telephone Encounter (Signed)
This has been completed.

## 2022-11-08 ENCOUNTER — Encounter: Payer: Self-pay | Admitting: Family

## 2022-11-08 DIAGNOSIS — S6992XA Unspecified injury of left wrist, hand and finger(s), initial encounter: Secondary | ICD-10-CM

## 2022-11-08 DIAGNOSIS — M19049 Primary osteoarthritis, unspecified hand: Secondary | ICD-10-CM | POA: Insufficient documentation

## 2022-11-08 DIAGNOSIS — M19242 Secondary osteoarthritis, left hand: Secondary | ICD-10-CM

## 2022-11-08 MED ORDER — MELOXICAM 7.5 MG PO TABS: 7.5000 mg | ORAL_TABLET | Freq: Every day | ORAL | 0 refills | Status: DC

## 2022-11-22 ENCOUNTER — Inpatient Hospital Stay (HOSPITAL_BASED_OUTPATIENT_CLINIC_OR_DEPARTMENT_OTHER): Admission: RE | Admit: 2022-11-22 | Payer: 59 | Source: Ambulatory Visit

## 2022-11-22 ENCOUNTER — Other Ambulatory Visit (HOSPITAL_BASED_OUTPATIENT_CLINIC_OR_DEPARTMENT_OTHER): Payer: 59

## 2022-11-22 DIAGNOSIS — S62657D Nondisplaced fracture of medial phalanx of left little finger, subsequent encounter for fracture with routine healing: Secondary | ICD-10-CM | POA: Insufficient documentation

## 2022-11-22 DIAGNOSIS — S63691A Other sprain of left index finger, initial encounter: Secondary | ICD-10-CM | POA: Insufficient documentation

## 2022-12-07 ENCOUNTER — Ambulatory Visit: Payer: BC Managed Care – PPO | Admitting: Family

## 2022-12-08 ENCOUNTER — Encounter (HOSPITAL_BASED_OUTPATIENT_CLINIC_OR_DEPARTMENT_OTHER): Payer: Self-pay

## 2022-12-08 ENCOUNTER — Ambulatory Visit (HOSPITAL_BASED_OUTPATIENT_CLINIC_OR_DEPARTMENT_OTHER)
Admission: RE | Admit: 2022-12-08 | Discharge: 2022-12-08 | Disposition: A | Discharge status: Home / Self Care | Payer: 59 | Source: Ambulatory Visit | Attending: Family | Admitting: Family

## 2022-12-08 DIAGNOSIS — Z1231 Encounter for screening mammogram for malignant neoplasm of breast: Secondary | ICD-10-CM | POA: Diagnosis present

## 2022-12-08 DIAGNOSIS — Z780 Asymptomatic menopausal state: Secondary | ICD-10-CM | POA: Diagnosis present

## 2022-12-18 ENCOUNTER — Encounter: Payer: Self-pay | Admitting: Family

## 2022-12-22 ENCOUNTER — Telehealth: Payer: Self-pay | Admitting: Family

## 2022-12-22 NOTE — Telephone Encounter (Signed)
Patient is requesting a call back about results for her mammogram, she said she wasn't able to access through mychart. Call back is [phone]

## 2022-12-23 NOTE — Telephone Encounter (Signed)
There is no report read on my end, she will need to call where she had it completed for an interpretation.

## 2022-12-23 NOTE — Telephone Encounter (Signed)
Spoke with the patient and advised to contact the imaging office to have her results interpreted. She will reach out to [facility].

## 2022-12-24 ENCOUNTER — Encounter: Payer: Self-pay | Admitting: Family

## 2022-12-24 NOTE — Telephone Encounter (Signed)
I see order and images but no report. I have called radiology number provided and left message to call office.

## 2023-01-10 ENCOUNTER — Telehealth: Payer: Self-pay | Admitting: Family

## 2023-01-10 NOTE — Telephone Encounter (Signed)
Pt called asking Dugal if she dealt with GYN issues? Pt states she believes there might be an vaginal infection going on. Call back # [phone]

## 2023-01-10 NOTE — Telephone Encounter (Signed)
Spoke with the patient and was not able to get clear symptoms, as she was in a public setting. I scheduled an OV to be seen.

## 2023-01-13 ENCOUNTER — Encounter: Payer: Self-pay | Admitting: Family

## 2023-01-13 ENCOUNTER — Ambulatory Visit: Payer: 59 | Admitting: Family

## 2023-01-13 ENCOUNTER — Encounter: Payer: Self-pay | Admitting: *Deleted

## 2023-01-13 VITALS — BP 116/72 | HR 76 | Temp 97.9°F | Ht 65.0 in | Wt 135.0 lb

## 2023-01-13 DIAGNOSIS — J3081 Allergic rhinitis due to animal (cat) (dog) hair and dander: Secondary | ICD-10-CM

## 2023-01-13 DIAGNOSIS — R300 Dysuria: Secondary | ICD-10-CM

## 2023-01-13 DIAGNOSIS — F3181 Bipolar II disorder: Secondary | ICD-10-CM | POA: Diagnosis not present

## 2023-01-13 DIAGNOSIS — M85851 Other specified disorders of bone density and structure, right thigh: Secondary | ICD-10-CM | POA: Insufficient documentation

## 2023-01-13 DIAGNOSIS — N898 Other specified noninflammatory disorders of vagina: Secondary | ICD-10-CM | POA: Diagnosis not present

## 2023-01-13 DIAGNOSIS — J45990 Exercise induced bronchospasm: Secondary | ICD-10-CM

## 2023-01-13 DIAGNOSIS — N942 Vaginismus: Secondary | ICD-10-CM | POA: Insufficient documentation

## 2023-01-13 DIAGNOSIS — N941 Unspecified dyspareunia: Secondary | ICD-10-CM

## 2023-01-13 DIAGNOSIS — R102 Pelvic and perineal pain: Secondary | ICD-10-CM | POA: Diagnosis not present

## 2023-01-13 DIAGNOSIS — J301 Allergic rhinitis due to pollen: Secondary | ICD-10-CM

## 2023-01-13 LAB — POC URINALSYSI DIPSTICK (AUTOMATED)
Bilirubin, UA: NEGATIVE
Blood, UA: POSITIVE
Glucose, UA: NEGATIVE
Ketones, UA: NEGATIVE
Leukocytes, UA: NEGATIVE
Nitrite, UA: NEGATIVE
Protein, UA: NEGATIVE
Spec Grav, UA: 1.01 (ref 1.010–1.025)
Urobilinogen, UA: 0.2 E.U./dL
pH, UA: 6 (ref 5.0–8.0)

## 2023-01-13 MED ORDER — MONTELUKAST SODIUM 10 MG PO TABS: 10.0000 mg | ORAL_TABLET | Freq: Every day | ORAL | 3 refills | Status: DC

## 2023-01-13 NOTE — Assessment & Plan Note (Signed)
Pt with vaginismus but also suspected vaginal atrophy.  Unable to perform pelvic due to pt refusal as well as vaginal pain upon insertion of anything more than a small swab.  If negative findings on wet prep may consider permarin to see if helpful.

## 2023-01-13 NOTE — Assessment & Plan Note (Addendum)
D/w pt to stay on and or start if not already vit d and calcium daily.  Advised to work on weight bearing exercises. We will repeat dexa every two years. Education sent to [employer].   Multiple concerns in office totaling approximately 42 minutes in office going over acute and chronic concerns, education on medication administration as well as recommendation RX or OTC wise for chronic concerns, as well as performing pelvic exam in office.

## 2023-01-13 NOTE — Progress Notes (Signed)
Established Patient Office Visit  Subjective:   Patient ID: Vicki Newman, female    DOB: 07-Sep-1961  Age: 62 y.o. MRN: [id number]  CC:  Chief Complaint  Patient presents with   Vaginal Itching    HPI: Vicki Newman is a 62 y.o. female presenting on 01/13/2023 for Vaginal Itching   Vaginal Itching    Started with symptoms about three weeks ago with vaginal itching (feels like inside) . She denies vaginal discharge. Denies dysuria, urinary urgency or frequency and or lower abdominal pain.   Did take monistat for about three days with mild improvement, but symptoms are now back.   She does note pain with intercourse.  She has tried replenz in the past.   Increased anxiety, wants her to have Noank however she doesn't want this at this time, and is unsatisfied with her current psychiatrist. Current psychiatrist doesn't think she has bipolar however she does have a past h/o mania, (after being weaned off of lithium) which was less than seven days in the hospital so psychiatrist unsure if she has bipolar and wants to taper her off of lamictal which pt is hesitant to. Wants another evaluation.   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 She does also say that her dad is currently being evaluated for bipolar as well as he has had angry outburst and possible mania as well.           ROS: Negative unless specifically indicated above in HPI.   Relevant past medical history reviewed and updated as indicated.   Allergies and medications reviewed and updated.   Current Outpatient Medications:    albuterol (VENTOLIN HFA) 108 (90 Base) MCG/ACT inhaler, Inhale 2 puffs into the lungs every 6 (six) hours as needed for wheezing or shortness of breath., Disp: 8 g, Rfl: 0   B Complex-C (B-COMPLEX WITH VITAMIN C) tablet, Take 1 tablet by mouth daily., Disp: , Rfl:    Cholecalciferol 50 MCG (2000 UT) TABS, Take by mouth., Disp:  , Rfl:    escitalopram (LEXAPRO) 10 MG tablet, Take 10 mg by mouth daily., Disp: , Rfl:    lamoTRIgine (LAMICTAL) 100 MG tablet, Take 100 mg by mouth 2 (two) times daily., Disp: , Rfl:    loratadine (CLARITIN) 10 MG tablet, Take 1 tablet (10 mg total) by mouth daily., Disp: 90 tablet, Rfl: 3   LORazepam (ATIVAN) 0.5 MG tablet, Take 0.5 mg by mouth every 8 (eight) hours as needed for anxiety or sleep., Disp: , Rfl:    montelukast (SINGULAIR) 10 MG tablet, Take 1 tablet (10 mg total) by mouth at bedtime., Disp: 30 tablet, Rfl: 3   omeprazole (PRILOSEC) 20 MG capsule, Take 1 capsule (20 mg total) by mouth daily., Disp: 90 capsule, Rfl: 1  Allergies  Allergen Reactions   Sulfa Antibiotics Rash   Valacyclovir Other (See Comments)    Other reaction(s): Dizziness (intolerance)    Objective:   BP 116/72   Pulse 76   Temp 97.9 F (36.6 C) (Temporal)   Ht '5\' 5"'$  (1.651 m)   Wt 135 lb (61.2 kg)   SpO2 99%   BMI 22.47 kg/m    Physical Exam Constitutional:      General: She is not in acute distress.    Appearance: Normal appearance. She is normal weight. She is not ill-appearing, toxic-appearing or diaphoretic.  HENT:     Head: Normocephalic.  Cardiovascular:     Rate  and Rhythm: Normal rate.  Pulmonary:     Effort: Pulmonary effort is normal.  Genitourinary:    Vagina: Vaginal discharge (slight white discharge thick at inner vaginal folds), tenderness and lesions (red abrasion type lesions at  base of vaginal canal, tenderness upon touch with swab) present. No erythema.     Comments: Some fecal particles, dried, in perineal aspect Musculoskeletal:        General: Normal range of motion.  Neurological:     General: No focal deficit present.     Mental Status: She is alert and oriented to person, place, and time. Mental status is at baseline.  Psychiatric:        Mood and Affect: Mood normal.        Behavior: Behavior normal.        Thought Content: Thought content normal.         Judgment: Judgment normal.     Assessment & Plan:  Bipolar 2 disorder, major depressive episode ([place]) Assessment & Plan: H/o manic episode, pt to continue lithium as prescribed.  Referral placed for psychiatry for second opinion.    Orders: -     Ambulatory referral to Psychiatry  Vaginal pain -     WET PREP BY MOLECULAR PROBE  Vaginal itching -     WET PREP BY MOLECULAR PROBE  Allergic rhinitis due to animal hair and dander  Seasonal allergic rhinitis due to pollen Assessment & Plan: Recommend continue with xyzal Stop Nasacort start flonase  Start Singulair 10 mg once nightly   Orders: -     Montelukast Sodium; Take 1 tablet (10 mg total) by mouth at bedtime.  Dispense: 30 tablet; Refill: 3  Dysuria -     POCT Urinalysis Dipstick (Automated)  Exercise induced bronchospasm Assessment & Plan: Proper instruction of albuterol use given to utilize thirty minutes prior to strenuous exercise    Vaginismus Assessment & Plan: Recommendations given for pelvic floor therapy and or referral for dilation.  Also can see sex therapist which may help to identify any emotional reasons for cause.    Pain in female genitalia on intercourse Assessment & Plan: Pt with vaginismus but also suspected vaginal atrophy.  Unable to perform pelvic due to pt refusal as well as vaginal pain upon insertion of anything more than a small swab.  If negative findings on wet prep may consider permarin to see if helpful.    Osteopenia of neck of right femur Assessment & Plan: D/w pt to stay on and or start if not already vit d and calcium daily.  Advised to work on weight bearing exercises. We will repeat dexa every two years. Education sent to [employer].   Multiple concerns in office totaling approximately 42 minutes in office going over acute and chronic concerns, education on medication administration as well as recommendation RX or OTC wise for chronic concerns, as well as performing pelvic  exam in office.      Follow up plan: Return in about 2 weeks (around 01/27/2023) for discuss results and see how doing with new medications for allergies.  Eugenia Pancoast, FNP

## 2023-01-13 NOTE — Assessment & Plan Note (Signed)
H/o manic episode, pt to continue lithium as prescribed.  Referral placed for psychiatry for second opinion.

## 2023-01-13 NOTE — Assessment & Plan Note (Signed)
Recommend continue with xyzal Stop Nasacort start flonase  Start Singulair 10 mg once nightly

## 2023-01-13 NOTE — Patient Instructions (Addendum)
A referral was placed today for psychiatry. This is for Dr. Nicolasa Ducking [phone] Please let us know if you have not heard back within 2 weeks about the referral.  For albuterol , make sure you [place] 30 minutes prior to exercise.  We will send in tiral Singulair as well which may help you with your allergies.  Continue xyzal and night for allergies as well, and start flonase (stop nasocort). Hopefully this triple therapy of medication will be helpful.   ------------------------------------ Recommend daily flonase and also zyrtec at night for allergies.  Start Singulair 10 mg nightly.  ------------------------------------  You have osteopenia (low bone mass), which can progress to osteoporosis.  Please take over the counter Calcium 1,200 mg and Vitamin  D3 800 IU once daily to protect your bones. Doing weightbearing exercise like walking, jogging, dancing, etc. is good for you bones.   We will repeat your bone density scan in two years to monitor progression/improvement.   ------------------------------------  For vaginal constriction, we can always refer again for pelvic floor therapy, you just let me know.   I will get back with you once I receive the results from today and we will treat accordingly.   If you have any other questions please don't hesitate to reach out.    Regards,   Eugenia Pancoast FNP-C

## 2023-01-13 NOTE — Assessment & Plan Note (Signed)
Recommendations given for pelvic floor therapy and or referral for dilation.  Also can see sex therapist which may help to identify any emotional reasons for cause.

## 2023-01-13 NOTE — Assessment & Plan Note (Signed)
Proper instruction of albuterol use given to utilize thirty minutes prior to strenuous exercise

## 2023-01-14 LAB — WET PREP BY MOLECULAR PROBE
Candida species: NOT DETECTED
Gardnerella vaginalis: NOT DETECTED
MICRO NUMBER:: 14693280
SPECIMEN QUALITY:: ADEQUATE
Trichomonas vaginosis: NOT DETECTED

## 2023-01-18 ENCOUNTER — Encounter: Payer: Self-pay | Admitting: Family

## 2023-02-23 ENCOUNTER — Encounter: Payer: Self-pay | Admitting: *Deleted

## 2023-02-23 ENCOUNTER — Ambulatory Visit: Payer: 59 | Admitting: Family

## 2023-02-23 ENCOUNTER — Encounter: Payer: Self-pay | Admitting: Family

## 2023-02-23 VITALS — BP 112/68 | HR 80 | Temp 97.9°F | Ht 65.0 in | Wt 136.8 lb

## 2023-02-23 DIAGNOSIS — J45990 Exercise induced bronchospasm: Secondary | ICD-10-CM | POA: Diagnosis not present

## 2023-02-23 DIAGNOSIS — R42 Dizziness and giddiness: Secondary | ICD-10-CM

## 2023-02-23 DIAGNOSIS — S0990XA Unspecified injury of head, initial encounter: Secondary | ICD-10-CM | POA: Insufficient documentation

## 2023-02-23 NOTE — Patient Instructions (Signed)
Call your eye doctor.  Follow up with Dr. Patsy Lager.    Regards,   Mort Sawyers FNP-C

## 2023-02-23 NOTE — Assessment & Plan Note (Signed)
Ongoing. Referral back to balance therapy  Suspect not related to fall however, this was pt reporting as tripped.

## 2023-02-23 NOTE — Assessment & Plan Note (Signed)
Did again d/w pt starting Singulair 10 mg nightly  She is going to start.  Did d/w her black box warning as well

## 2023-02-23 NOTE — Assessment & Plan Note (Signed)
Reviewed CT head from hospital, no acute findings.  Neuro exam in office today reassuring.  Did advise pt to f/u with ophthalmologist for new eye RX to r/o eye strain, headaches However do suspect possible concussion  Sending her to Dr Patsy Lager for additional eval Print out sent to [place] on concussions for pt

## 2023-02-23 NOTE — Progress Notes (Signed)
Established Patient Office Visit  Subjective:      CC:  Chief Complaint  Patient presents with   Hospitalization Follow-up    Headaches from fall. Hard to watch tv continuously.   Dizziness    HPI: Vicki Newman is a 63 y.o. female presenting on 02/23/2023 for Hospitalization Follow-up (Headaches from fall. Hard to watch tv continuously.) and Dizziness . 4/14 Went to [place] in [place] in [place] , she states that she was walking and was not paying attention to the sidewalk  And fell forward striking her forehead on the sidewalk, right upper forehead. There was no LOC.   She did also start a new psychiatrist with new medications, had been switched to prozac, and she questioned if this contributing but denies any lightheadedness prior to fall. She states she tripped on the sidewalk.   Was stitched with three dissolvable sutures right upper forehead.  In the office imaging was ordered and reviewed.  CT head, no acute hemorrhage. Chronic small vessel ischemic change. Small right frontal scalp swelling.  Ct sinus , no acute findings.  Ct cervical spine, not acute findings. Reversal of usual cervical lordosis. Facet atrophy  Bil knee xray, no acute findings , mild degenerative changes and EKG bradycardia no ischemia    Dr. Lance Coon, [facility].  Dr. Randa Evens at [facility] behavioral health prior.   New complaints: Since the episode, she does have nightly headaches, dull and achy and she feels them in the frontal area. They will last a few hours until she falls asleep or takes a tylenol. She doesn't think this is eye strain. She did get a new pair of prescription glasses with a recent new change in RX about five days after the fall. She does note prior to getting the glasses though she did have some eye strain while watching screens and was around the same area.  She can not seem to remember if this is worse since the new RX.  This usually happens at night time.   Unrelated, this has  been a chronic issue with dysequilibrium She has been to balance therapy in the past about eight months ago that caused dizziness. She is requesting this again. She states she doesn't feel as though the trip on the sidewalk had to do with her unstable gait, however she does find that often her balance is off.      Social history:  Relevant past medical, surgical, family and social history reviewed and updated as indicated. Interim medical history since our last visit reviewed.  Allergies and medications reviewed and updated.  DATA REVIEWED: CHART IN EPIC     ROS: Negative unless specifically indicated above in HPI.    Current Outpatient Medications:    albuterol (VENTOLIN HFA) 108 (90 Base) MCG/ACT inhaler, Inhale 2 puffs into the lungs every 6 (six) hours as needed for wheezing or shortness of breath., Disp: 8 g, Rfl: 0   B Complex-C (B-COMPLEX WITH VITAMIN C) tablet, Take 1 tablet by mouth daily., Disp: , Rfl:    Cholecalciferol 50 MCG (2000 UT) TABS, Take by mouth., Disp: , Rfl:    FLUoxetine (PROZAC) 20 MG capsule, Take by mouth., Disp: , Rfl:    lamoTRIgine (LAMICTAL) 100 MG tablet, Take 100 mg by mouth 2 (two) times daily., Disp: , Rfl:    levocetirizine (XYZAL) 5 MG tablet, Take 5 mg by mouth every evening., Disp: , Rfl:    LORazepam (ATIVAN) 0.5 MG tablet, Take 0.5 mg by mouth every 8 (eight)  hours as needed for anxiety or sleep., Disp: , Rfl:    montelukast (SINGULAIR) 10 MG tablet, Take 1 tablet (10 mg total) by mouth at bedtime., Disp: 30 tablet, Rfl: 3   omeprazole (PRILOSEC) 20 MG capsule, Take 1 capsule (20 mg total) by mouth daily., Disp: 90 capsule, Rfl: 1      Objective:    BP 112/68   Pulse 80   Temp 97.9 F (36.6 C) (Temporal)   Ht  (1.651 m)   Wt 136 lb 12.8 oz (62.1 kg)   SpO2 98%   BMI 22.76 kg/m   Wt Readings from Last 3 Encounters:  02/23/23 136 lb 12.8 oz (62.1 kg)  01/13/23 135 lb (61.2 kg)  11/04/22 136 lb (61.7 kg)    Physical  Exam Constitutional:      General: She is not in acute distress.    Appearance: Normal appearance. She is normal weight. She is not ill-appearing, toxic-appearing or diaphoretic.  HENT:     Head: Normocephalic.  Cardiovascular:     Rate and Rhythm: Normal rate and regular rhythm.  Pulmonary:     Effort: Pulmonary effort is normal.     Breath sounds: Normal breath sounds.  Musculoskeletal:        General: Normal range of motion.  Neurological:     General: No focal deficit present.     Mental Status: She is alert and oriented to person, place, and time. Mental status is at baseline.     Cranial Nerves: Cranial nerves 2-12 are intact.     Sensory: Sensation is intact.     Motor: Motor function is intact.     Coordination: Romberg sign negative. Heel to [facility] Test abnormal (gait slightly unstable).     Gait: Gait is intact.  Psychiatric:        Mood and Affect: Mood normal.        Behavior: Behavior normal.        Thought Content: Thought content normal.        Judgment: Judgment normal.            Assessment & Plan:  Dizziness and giddiness Assessment & Plan: Ongoing. Referral back to balance therapy  Suspect not related to fall however, this was pt reporting as tripped.    Orders: -     Referral to Neuro Rehab  Dysequilibrium -     Referral to Neuro Rehab  Exercise induced bronchospasm Assessment & Plan: Did again d/w pt starting Singulair 10 mg nightly  She is going to start.  Did d/w her black box warning as well    Traumatic injury of head, initial encounter Assessment & Plan: Reviewed CT head from hospital, no acute findings.  Neuro exam in office today reassuring.  Did advise pt to f/u with ophthalmologist for new eye RX to r/o eye strain, headaches However do suspect possible concussion  Sending her to Dr Patsy Lager for additional eval Print out sent to [place] on concussions for pt      Return in about 6 months (around 08/25/2023) for f/u  CPE.  Mort Sawyers, MSN, APRN, FNP-C [facility]

## 2023-03-05 ENCOUNTER — Encounter: Payer: Self-pay | Admitting: Family

## 2023-03-05 DIAGNOSIS — N952 Postmenopausal atrophic vaginitis: Secondary | ICD-10-CM

## 2023-03-07 MED ORDER — PREMARIN 0.625 MG/GM VA CREA: TOPICAL_CREAM | VAGINAL | 12 refills | Status: DC

## 2023-03-18 ENCOUNTER — Other Ambulatory Visit: Payer: Self-pay | Admitting: Family

## 2023-03-18 DIAGNOSIS — K219 Gastro-esophageal reflux disease without esophagitis: Secondary | ICD-10-CM

## 2023-03-18 NOTE — Telephone Encounter (Signed)
Refill omeprazole (PRILOSEC) 20 MG capsule  (90 day) LV- 02/23/23

## 2023-03-24 ENCOUNTER — Encounter: Payer: Self-pay | Admitting: Family

## 2023-03-25 NOTE — Telephone Encounter (Signed)
Script for premarin cream was sent to [facility], [place], [place]  03/07/23. Patient needs to contact her pharmacy.

## 2023-04-12 ENCOUNTER — Encounter: Payer: Self-pay | Admitting: Family

## 2023-04-12 DIAGNOSIS — N952 Postmenopausal atrophic vaginitis: Secondary | ICD-10-CM

## 2023-04-13 MED ORDER — ESTRADIOL 0.1 MG/GM VA CREA: TOPICAL_CREAM | VAGINAL | 12 refills | Status: DC

## 2023-04-14 ENCOUNTER — Ambulatory Visit: Payer: 59 | Admitting: Family Medicine

## 2023-04-14 ENCOUNTER — Encounter: Payer: Self-pay | Admitting: Family Medicine

## 2023-04-14 VITALS — BP 100/70 | HR 69 | Temp 98.6°F | Ht 65.0 in | Wt 135.2 lb

## 2023-04-14 DIAGNOSIS — R21 Rash and other nonspecific skin eruption: Secondary | ICD-10-CM | POA: Diagnosis not present

## 2023-04-14 MED ORDER — TRIAMCINOLONE ACETONIDE 0.1 % EX CREA: 1.0000 | TOPICAL_CREAM | Freq: Two times a day (BID) | CUTANEOUS | 0 refills | Status: DC

## 2023-04-14 NOTE — Progress Notes (Signed)
Vicki Jane T. Herbert Marken, MD, CAQ [facility] at [facility] [address] [place], [address]  Phone: [phone]  FAX: [phone]  Vicki Newman - 62 y.o. female  MRN [id number]  Date of Birth: 03/28/1961  Date: 04/14/2023  PCP: Mort Sawyers, FNP  Referral: Mort Sawyers, FNP  Chief Complaint  Patient presents with   Rash   Subjective:   Vicki Newman is a 62 y.o. very pleasant female patient with Body mass index is 22.51 kg/m. who presents with the following:  She is a very pleasant lady and she has had a itchy, lacy rash in her torso and back for roughly 7 days.  She has not had any known exposure to any kind of new plant life, detergent, cologne, body spray.  She did start a new lotion, but this was 2 or 3 weeks ago.  The only other new medication is a new antipsychotic, and this was started a few days before the onset of the rash. (Vrylar)  Rash on her torso.   Itchy No recent plant exposure  Recently started Vrylar - anti-psychotic.  Still on Lamictal.   New lotion    Review of Systems is noted in the HPI, as appropriate  Objective:   BP 100/70 (BP Location: Left Arm, Patient Position: Sitting, Cuff Size: Normal)   Pulse 69   Temp 98.6 F (37 C) (Temporal)   Ht 5\' 5"  (1.651 m)   Wt 135 lb 4 oz (61.3 kg)   SpO2 97%   BMI 22.51 kg/m   GEN: No acute distress; alert,appropriate. PULM: Breathing comfortably in no respiratory distress PSYCH: Normally interactive.   Fine, lacy pinkish rash in the front of the torso and middle to lower back.  Laboratory and Imaging Data:  Assessment and Plan:     ICD-10-CM   1. Rash  R21      I am somewhat doubtful that this is from her antipsychotic.  She has stopped this for 5 days through the direction of her psychiatrist.  I would not discount this medication because of the rash.  I would let the rash resolved for a couple of days, and then if her psychiatrist  agrees I think restarting this antipsychotic would be reasonable and appropriate.  If the rash does come back, then it would be likely from this new medication.  I gave her some triamcinolone cream for the itchiness and rash.  Medication Management during today's office visit: Meds ordered this encounter  Medications   triamcinolone cream (KENALOG) 0.1 %    Sig: Apply 1 Application topically 2 (two) times daily.    Dispense:  454 g    Refill:  0   There are no discontinued medications.  Orders placed today for conditions managed today: No orders of the defined types were placed in this encounter.   Disposition: No follow-ups on file.  [employer] speech-to-text software was used for transcription in this dictation.  Possible transcriptional errors can occur using Animal nutritionist.   Signed,  Elpidio Galea. Cashe Gatt, MD   Outpatient Encounter Medications as of 04/14/2023  Medication Sig   albuterol (VENTOLIN HFA) 108 (90 Base) MCG/ACT inhaler Inhale 2 puffs into the lungs every 6 (six) hours as needed for wheezing or shortness of breath.   B Complex-C (B-COMPLEX WITH VITAMIN C) tablet Take 1 tablet by mouth daily.   cariprazine (VRAYLAR) 1.5 MG capsule Take 1.5 mg by mouth daily.   Cholecalciferol 50 MCG (2000 UT)  TABS Take by mouth.   estazolam (PROSOM) 2 MG tablet Take 2 mg by mouth at bedtime.   estradiol (ESTRACE VAGINAL) 0.1 MG/GM vaginal cream Administer intravaginally daily for two weeks then twice weekly for maintenance   FLUoxetine (PROZAC) 20 MG capsule Take by mouth.   lamoTRIgine (LAMICTAL) 100 MG tablet Take 100 mg by mouth 2 (two) times daily.   levocetirizine (XYZAL) 5 MG tablet Take 5 mg by mouth every evening.   LORazepam (ATIVAN) 0.5 MG tablet Take 0.5 mg by mouth every 8 (eight) hours as needed for anxiety or sleep.   montelukast (SINGULAIR) 10 MG tablet Take 1 tablet (10 mg total) by mouth at bedtime.   omeprazole (PRILOSEC) 20 MG capsule TAKE 1 CAPSULE BY  MOUTH DAILY   triamcinolone cream (KENALOG) 0.1 % Apply 1 Application topically 2 (two) times daily.   No facility-administered encounter medications on file as of 04/14/2023.

## 2023-04-20 ENCOUNTER — Encounter: Payer: Self-pay | Admitting: Family

## 2023-04-20 DIAGNOSIS — N952 Postmenopausal atrophic vaginitis: Secondary | ICD-10-CM

## 2023-04-21 MED ORDER — ESTRADIOL 0.1 MG/GM VA CREA
TOPICAL_CREAM | VAGINAL | 12 refills | Status: DC
Start: 2023-04-21 — End: 2023-05-12

## 2023-05-02 ENCOUNTER — Encounter: Payer: Self-pay | Admitting: Family

## 2023-05-12 ENCOUNTER — Encounter: Payer: Self-pay | Admitting: Family

## 2023-05-12 ENCOUNTER — Ambulatory Visit: Payer: 59 | Admitting: Family

## 2023-05-12 ENCOUNTER — Other Ambulatory Visit (HOSPITAL_COMMUNITY)
Admission: RE | Admit: 2023-05-12 | Discharge: 2023-05-12 | Disposition: A | Discharge status: Home / Self Care | Payer: 59 | Source: Ambulatory Visit | Attending: Family | Admitting: Family

## 2023-05-12 VITALS — BP 120/74 | HR 63 | Temp 98.0°F | Ht 65.0 in | Wt 135.0 lb

## 2023-05-12 DIAGNOSIS — R102 Pelvic and perineal pain: Secondary | ICD-10-CM | POA: Diagnosis not present

## 2023-05-12 DIAGNOSIS — Z124 Encounter for screening for malignant neoplasm of cervix: Secondary | ICD-10-CM | POA: Diagnosis not present

## 2023-05-12 DIAGNOSIS — N941 Unspecified dyspareunia: Secondary | ICD-10-CM | POA: Diagnosis not present

## 2023-05-12 DIAGNOSIS — N898 Other specified noninflammatory disorders of vagina: Secondary | ICD-10-CM | POA: Diagnosis not present

## 2023-05-12 DIAGNOSIS — N952 Postmenopausal atrophic vaginitis: Secondary | ICD-10-CM

## 2023-05-12 NOTE — Assessment & Plan Note (Addendum)
Transvaginal u/s ordered pending results with right pelvic tenderness R/o mass, cyst and or fibroid.  May consider referral to GYN pending results.  On physical exam appears to confirm vaginal atrophy however issue with estradiol so may consider referral to gyn for further treatment eval

## 2023-05-12 NOTE — Progress Notes (Signed)
Established Patient Office Visit  Subjective:   Patient ID: Vicki Newman, female    DOB: October 26, 1961  Age: 62 y.o. MRN: [id number]  CC:  Chief Complaint  Patient presents with   Annual Exam    HPI: Vicki Newman is a 62 y.o. female presenting on 05/12/2023 for Annual Exam  Has been trying to use vaginal cream but she tries to use the applicator but then she feels the resistance which she feels something is coming outside of her vagina" She is not having urinary incontinence.  She does not have vaginal discharge. No abnormal vaginal odor.   Does not have urinary frequency urgency and or burning.   She does have lower pelvic pain, comes and goes. Used to only happen after orgasm, now increasing since had problem with applicator, about two weeks.   She does have pain with orgasm and even oral sex. Pain with sex.  When she was using the premarin cream she had itching burning. Has not had sex since then. No abnormal bleeding.         ROS: Negative unless specifically indicated above in HPI.   Relevant past medical history reviewed and updated as indicated.   Allergies and medications reviewed and updated.   Current Outpatient Medications:    albuterol (VENTOLIN HFA) 108 (90 Base) MCG/ACT inhaler, Inhale 2 puffs into the lungs every 6 (six) hours as needed for wheezing or shortness of breath., Disp: 8 g, Rfl: 0   B Complex-C (B-COMPLEX WITH VITAMIN C) tablet, Take 1 tablet by mouth daily., Disp: , Rfl:    Cholecalciferol 50 MCG (2000 UT) TABS, Take by mouth., Disp: , Rfl:    estazolam (PROSOM) 2 MG tablet, Take 1 mg by mouth at bedtime., Disp: , Rfl:    FLUoxetine (PROZAC) 20 MG capsule, Take by mouth., Disp: , Rfl:    lamoTRIgine (LAMICTAL) 100 MG tablet, Take 100 mg by mouth 2 (two) times daily., Disp: , Rfl:    levocetirizine (XYZAL) 5 MG tablet, Take 5 mg by mouth every evening., Disp: , Rfl:    LORazepam (ATIVAN) 0.5 MG tablet, Take 0.5 mg by mouth every 8  (eight) hours as needed for anxiety or sleep., Disp: , Rfl:    montelukast (SINGULAIR) 10 MG tablet, Take 1 tablet (10 mg total) by mouth at bedtime., Disp: 30 tablet, Rfl: 3   omeprazole (PRILOSEC) 20 MG capsule, TAKE 1 CAPSULE BY MOUTH DAILY, Disp: 30 capsule, Rfl: 3  Allergies  Allergen Reactions   Sulfa Antibiotics Rash   Valacyclovir Other (See Comments)    Other reaction(s): Dizziness (intolerance)   Estradiol Itching    Vaginal pain and itching   Vraylar [Cariprazine] Rash    Objective:   BP 120/74   Pulse 63   Temp 98 F (36.7 C) (Temporal)   Ht 5\' 5"  (1.651 m)   Wt 135 lb (61.2 kg)   SpO2 99%   BMI 22.47 kg/m    Physical Exam Abdominal:     Hernia: There is no hernia in the right inguinal area.  Genitourinary:    Pubic Area: No rash.      Vagina: Vaginal discharge (white thin milky) present.     Cervix: Friability present.     Adnexa:        Right: Tenderness and fullness present. No mass.       Comments: Vaginal entrance tenderness with excoriation and redness     Assessment & Plan:  Pain in female genitalia on intercourse Assessment &  Plan: Transvaginal u/s ordered pending results with right pelvic tenderness R/o mass, cyst and or fibroid.  May consider referral to GYN pending results.  On physical exam appears to confirm vaginal atrophy however issue with estradiol so may consider referral to gyn for further treatment eval  Orders: -     US PELVIC COMPLETE WITH TRANSVAGINAL; Future -     WET PREP BY MOLECULAR PROBE -     Cytology - PAP -     Ambulatory referral to Obstetrics / Gynecology  Screening for cervical cancer -     Cytology - PAP  Vaginal discharge Assessment & Plan: Wet prep ordered pending results.  R/o BV  Orders: -     WET PREP BY MOLECULAR PROBE  Vaginal atrophy -     Ambulatory referral to Obstetrics / Gynecology  Pelvic pain -     US PELVIC COMPLETE WITH TRANSVAGINAL; Future -     Cytology - PAP -     Ambulatory  referral to Obstetrics / Gynecology     Follow up plan: Return if symptoms worsen or fail to improve.  Mort Sawyers, FNP

## 2023-05-12 NOTE — Assessment & Plan Note (Signed)
Wet prep ordered pending results.  R/o BV

## 2023-05-13 ENCOUNTER — Encounter: Payer: Self-pay | Admitting: *Deleted

## 2023-05-13 ENCOUNTER — Other Ambulatory Visit: Payer: Self-pay | Admitting: Family

## 2023-05-13 DIAGNOSIS — B9689 Other specified bacterial agents as the cause of diseases classified elsewhere: Secondary | ICD-10-CM

## 2023-05-13 LAB — CYTOLOGY - PAP
Adequacy: ABSENT
Comment: NEGATIVE
Diagnosis: NEGATIVE
High risk HPV: NEGATIVE

## 2023-05-13 LAB — WET PREP BY MOLECULAR PROBE
Candida species: NOT DETECTED
MICRO NUMBER:: 15188151
SPECIMEN QUALITY:: ADEQUATE
Trichomonas vaginosis: NOT DETECTED

## 2023-05-13 MED ORDER — METRONIDAZOLE 500 MG PO TABS
500.0000 mg | ORAL_TABLET | Freq: Two times a day (BID) | ORAL | 0 refills | Status: AC
Start: 2023-05-13 — End: 2023-05-20

## 2023-05-16 ENCOUNTER — Telehealth (HOSPITAL_BASED_OUTPATIENT_CLINIC_OR_DEPARTMENT_OTHER): Payer: Self-pay

## 2023-05-19 ENCOUNTER — Encounter: Payer: Self-pay | Admitting: Family

## 2023-05-20 ENCOUNTER — Encounter: Payer: Self-pay | Admitting: Family

## 2023-05-24 ENCOUNTER — Ambulatory Visit (HOSPITAL_BASED_OUTPATIENT_CLINIC_OR_DEPARTMENT_OTHER): Payer: 59

## 2023-06-16 ENCOUNTER — Ambulatory Visit (HOSPITAL_BASED_OUTPATIENT_CLINIC_OR_DEPARTMENT_OTHER)
Admission: RE | Admit: 2023-06-16 | Discharge: 2023-06-16 | Disposition: A | Discharge status: Home / Self Care | Payer: 59 | Source: Ambulatory Visit | Attending: Family | Admitting: Family

## 2023-06-16 DIAGNOSIS — N941 Unspecified dyspareunia: Secondary | ICD-10-CM | POA: Insufficient documentation

## 2023-06-16 DIAGNOSIS — R102 Pelvic and perineal pain: Secondary | ICD-10-CM | POA: Insufficient documentation

## 2023-06-21 ENCOUNTER — Telehealth: Payer: Self-pay

## 2023-06-21 NOTE — Telephone Encounter (Signed)
Called patient to schedule a new patient appointment. Patient did not want to come to our office but wanted an other office in [facility]. I informed her that we are the only Center for [facility] in [place].   She said if she needs [place], she will call us. I am going to defer the referral for now.

## 2023-07-05 ENCOUNTER — Encounter: Payer: Self-pay | Admitting: Family

## 2023-07-05 DIAGNOSIS — N760 Acute vaginitis: Secondary | ICD-10-CM

## 2023-07-06 NOTE — Telephone Encounter (Signed)
You need to see in office for evaluation right

## 2023-07-07 ENCOUNTER — Ambulatory Visit
Admission: RE | Admit: 2023-07-07 | Discharge: 2023-07-07 | Disposition: A | Discharge status: Home / Self Care | Payer: 59 | Source: Ambulatory Visit | Attending: Family Medicine | Admitting: Family Medicine

## 2023-07-07 VITALS — BP 113/69 | HR 51 | Temp 98.4°F | Resp 16 | Ht 65.0 in | Wt 130.0 lb

## 2023-07-07 DIAGNOSIS — N898 Other specified noninflammatory disorders of vagina: Secondary | ICD-10-CM | POA: Diagnosis present

## 2023-07-07 NOTE — Discharge Instructions (Signed)
You can check my Chart for results You will be called if any results are positive We will prescribe medication based on your results

## 2023-07-07 NOTE — Telephone Encounter (Signed)
Advise pt that she come in for repeat wet prep to see if still there, however should be improved with treatment. Sometimes it is normal to have thin white discharge however. And make sure that she knows to clean the dilator every use with gentle soap and water.   If wet prep comes back negative and pt still with concerns will need office visit.

## 2023-07-07 NOTE — Addendum Note (Signed)
Addended by: Mort Sawyers on: 07/07/2023 02:25 PM   Modules accepted: Orders

## 2023-07-07 NOTE — ED Triage Notes (Signed)
Patient c/o white thin vaginal discharge x 1 week, denies any odor, itchy and burning.  No concern for STI.  Patient was recently treated for BV x 1 month ago and given Flagyl.  Same recurrent sx's.

## 2023-07-07 NOTE — Telephone Encounter (Signed)
Spoke with pt on the phone due to the nature of her message. She has been scheduled as a nurse visit on 07/19/2023 to do a self swab wet prep; could not schedule next week due to her being out of town. Pt is aware that she will need an OV if the wet prep is negative. While speaking to the pt she stated that she might go to urgent care to have the swab done because she does not want to go out of town feeling the way she does. She asked for me to keep her appointment scheduled on 07/19/2023 just in case she changes her mind about going to the urgent care. Pt also stated that she tried to make a GYN appointment but could not get in with someone until November. She wants to know if Wyatt Mage would place a referral to get her an appointment at [facility] instead.  Tabitha - please advise. Thanks!

## 2023-07-07 NOTE — ED Provider Notes (Addendum)
[facility]    CSN: [id number] Arrival date & time: 07/07/23  1850      History   Chief Complaint Chief Complaint  Patient presents with   Vaginal Discharge    White thin discharge, Need wet prep vaginal specimen according to primary NP Tabitha Dugal, and antibiotic.  Have already been on Metronidazole - Entered by patient    HPI Vicki Newman is a 63 y.o. female.   Patient had BV in July.  She was given a course of metronidazole.  She is not sure what ever cleared up.  She has vaginal discharge again this week.  No odor.  No discomfort.  She would like to be checked for BV.  She understands that she will be treated based on her test results.  I have read the notes from her primary care doctor, who voiced this plan.    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   Esotropia of both eyes 10/05/2022   Hyperopia with regular astigmatism and presbyopia, bilateral 10/05/2022   Exercise induced bronchospasm 09/13/2022   Elevated parathyroid hormone 09/06/2022   Seasonal affective disorder (HCC) 09/06/2022   Elevated liver function tests 09/06/2022   Visual changes 08/19/2022   Chronic right shoulder pain 03/30/2022   Postmenopausal 03/30/2022   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      Home Medications    Prior to Admission medications   Medication Sig Start Date End Date Taking? Authorizing Provider  albuterol (VENTOLIN HFA) 108 (90 Base) MCG/ACT inhaler Inhale 2 puffs into the lungs every 6 (six) hours as needed for wheezing or shortness of breath. 09/13/22  Yes Dugal, Tabitha, FNP  B Complex-C (B-COMPLEX WITH VITAMIN C) tablet Take 1 tablet by mouth daily.   Yes [provider]  Cholecalciferol 50 MCG (2000 UT) TABS Take by mouth.   Yes [provider]  estazolam (PROSOM) 2 MG tablet Take 1 mg by mouth at bedtime.   Yes [provider]  FLUoxetine (PROZAC) 20 MG capsule Take 30 mg by mouth daily.   Yes [provider]  lamoTRIgine (LAMICTAL) 100 MG tablet Take 100 mg by mouth 2 (two) times daily.   Yes [provider]  levocetirizine (XYZAL) 5 MG tablet Take 5 mg by mouth  every evening.   Yes [provider]  lithium carbonate 150 MG capsule Take 300 mg by mouth at bedtime. 05/11/23  Yes [provider]  LORazepam (ATIVAN) 0.5 MG tablet Take 0.5 mg by mouth every 8 (eight) hours as needed for anxiety or sleep.   Yes [provider]  montelukast (SINGULAIR) 10 MG tablet Take 1 tablet (10 mg total) by mouth at bedtime. 01/13/23  Yes Dugal, Wyatt Mage, FNP  omeprazole (PRILOSEC) 20 MG capsule TAKE 1 CAPSULE BY MOUTH DAILY 03/18/23  Yes Mort Sawyers, FNP    Family History Family History  Problem Relation Age of Onset   Hypertension Mother    Hearing loss Mother     Depression Mother    Lung cancer Mother    Arthritis Mother    Alcohol abuse Mother    Mental illness Mother    Mental illness Father    Hearing loss Father    Depression Father    Arthritis Father    Alcohol abuse Father    Hyperlipidemia Brother    Depression Brother    Alcohol abuse Brother    Mental illness Brother    Depression Brother    COPD Paternal Grandfather    Breast cancer Cousin     Social History Social History   Tobacco Use   Smoking status: Never   Smokeless tobacco: Never  Vaping Use   Vaping status: Never Used  Substance Use Topics   Alcohol use: Never   Drug use: Never     Allergies   Sulfa antibiotics, Valacyclovir, Estradiol, and Vraylar [cariprazine]   Review of Systems Review of Systems See HPI  Physical Exam Triage Vital Signs ED Triage Vitals  Encounter Vitals Group     BP 07/07/23 1901 113/69     Systolic BP Percentile --      Diastolic BP Percentile --      Pulse Rate 07/07/23 1901 (!) 51     Resp 07/07/23 1901 16     Temp 07/07/23 1901 98.4 F (36.9 C)     Temp Source 07/07/23 1901 Oral     SpO2 07/07/23 1901 98 %     Weight 07/07/23 1902 130 lb (59 kg)     Height 07/07/23 1902 5\' 5"  (1.651 m)     Head Circumference --      Peak Flow --      Pain Score 07/07/23 1902 0     Pain Loc --      Pain Education --      Exclude from Growth Chart --    No data found.  Updated Vital Signs BP 113/69 (BP Location: Right Arm)   Pulse (!) 51   Temp 98.4 F (36.9 C) (Oral)   Resp 16   Ht 5\' 5"  (1.651 m)   Wt 59 kg   SpO2 98%   BMI 21.63 kg/m       Physical Exam Constitutional:      General: She is not in acute distress.    Appearance: She is well-developed. She is not ill-appearing.  HENT:     Head: Normocephalic and atraumatic.  Eyes:     Conjunctiva/sclera: Conjunctivae normal.     Pupils: Pupils are equal, round, and reactive to light.  Cardiovascular:     Rate and Rhythm: Normal rate.  Pulmonary:      Effort: Pulmonary effort is normal. No respiratory distress.  Abdominal:     General: There is no distension.     Palpations:  Abdomen is soft.  Musculoskeletal:        General: Normal range of motion.     Cervical back: Normal range of motion.  Skin:    General: Skin is warm and dry.  Neurological:     Mental Status: She is alert.      UC Treatments / Results  Labs (all labs ordered are listed, but only abnormal results are displayed) Labs Reviewed  CERVICOVAGINAL ANCILLARY ONLY   ADDENDUM: 07/11/2023 The cervicovaginal swab was negative  Initial Impression / Assessment and Plan / UC Course  I have reviewed the triage vital signs and the nursing notes.  Pertinent labs & imaging results that were available during my care of the patient were reviewed by me and considered in my medical decision making (see chart for details).  Clinical Course as of 07/11/23 1412  Mon Jul 11, 2023  1344 Cervicovaginal ancillary only [YN]    Clinical Course User Index [YN] Eustace Moore, MD    BV discussed Final Clinical Impressions(s) / UC Diagnoses   Final diagnoses:  Vaginal discharge     Discharge Instructions      You can check my Chart for results You will be called if any results are positive We will prescribe medication based on your results    ED Prescriptions   None    PDMP not reviewed this encounter.   Eustace Moore, MD 07/07/23 Barnie Mort    Eustace Moore, MD 07/11/23 [phone]

## 2023-07-11 LAB — CERVICOVAGINAL ANCILLARY ONLY
Bacterial Vaginitis (gardnerella): NEGATIVE
Candida Glabrata: NEGATIVE
Candida Vaginitis: NEGATIVE
Comment: NEGATIVE
Comment: NEGATIVE
Comment: NEGATIVE

## 2023-07-19 ENCOUNTER — Ambulatory Visit: Payer: 59

## 2023-08-02 ENCOUNTER — Telehealth: Payer: 59 | Admitting: Physician Assistant

## 2023-08-02 DIAGNOSIS — B9689 Other specified bacterial agents as the cause of diseases classified elsewhere: Secondary | ICD-10-CM

## 2023-08-02 DIAGNOSIS — J019 Acute sinusitis, unspecified: Secondary | ICD-10-CM | POA: Diagnosis not present

## 2023-08-02 MED ORDER — DOXYCYCLINE HYCLATE 100 MG PO TABS
100.0000 mg | ORAL_TABLET | Freq: Two times a day (BID) | ORAL | 0 refills | Status: DC
Start: 2023-08-02 — End: 2023-08-12

## 2023-08-02 NOTE — Progress Notes (Signed)
Virtual Visit Consent   Vicki Newman, you are scheduled for a virtual visit with a [facility] provider today. Just as with appointments in the office, your consent must be obtained to participate. Your consent will be active for this visit and any virtual visit you may have with one of our providers in the next 365 days. If you have a MyChart account, a copy of this consent can be sent to you electronically.  As this is a virtual visit, video technology does not allow for your provider to perform a traditional examination. This may limit your provider's ability to fully assess your condition. If your provider identifies any concerns that need to be evaluated in person or the need to arrange testing (such as labs, EKG, etc.), we will make arrangements to do so. Although advances in technology are sophisticated, we cannot ensure that it will always work on either your end or our end. If the connection with a video visit is poor, the visit may have to be switched to a telephone visit. With either a video or telephone visit, we are not always able to ensure that we have a secure connection.  By engaging in this virtual visit, you consent to the provision of healthcare and authorize for your insurance to be billed (if applicable) for the services provided during this visit. Depending on your insurance coverage, you may receive a charge related to this service.  I need to obtain your verbal consent now. Are you willing to proceed with your visit today? Vicki Newman has provided verbal consent on 08/02/2023 for a virtual visit (video or telephone). Margaretann Loveless, PA-C  Date: 08/02/2023 4:19 PM  Virtual Visit via Video Note   I, Margaretann Loveless, connected with  Vicki Newman  ([id number], Jun 23, 1961) on 08/02/23 at  4:15 PM EDT by a video-enabled telemedicine application and verified that I am speaking with the correct person using two identifiers.  Location: Patient: Virtual Visit  Location Patient: Home Provider: Virtual Visit Location Provider: Home Office   I discussed the limitations of evaluation and management by telemedicine and the availability of in person appointments. The patient expressed understanding and agreed to proceed.    History of Present Illness: Vicki Newman is a 62 y.o. who identifies as a female who was assigned female at birth, and is being seen today for possible sinus infection.  HPI: Sinusitis This is a new problem. Episode onset: Had Covid 19 diagnosed on 07/15/23; took and completed Paxlovid; Symptoms never completely resolved and have progressively worsening. The problem has been gradually worsening since onset. The maximum temperature recorded prior to her arrival was 100.4 - 100.9 F. The fever has been present for Less than 1 day. The pain is mild. Associated symptoms include congestion, headaches, sinus pressure and a sore throat. Pertinent negatives include no chills, coughing, diaphoresis, ear pain, hoarse voice, shortness of breath, sneezing or swollen glands. (fatigue) Treatments tried: tylenol, ibuprofen, sudafed, airborne, Vit C 1000mg , zinc, nasacort, singulair, neti pot. The treatment provided no relief.     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   Esotropia of both eyes 10/05/2022   Hyperopia with regular astigmatism and presbyopia, bilateral 10/05/2022   Exercise induced bronchospasm 09/13/2022   Elevated parathyroid hormone 09/06/2022   Seasonal affective disorder (HCC) 09/06/2022    Elevated liver function tests 09/06/2022   Visual changes 08/19/2022   Chronic right shoulder pain 03/30/2022   Postmenopausal 03/30/2022   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   Bipolar 2 disorder, major depressive episode (HCC) 03/15/2022   Dizziness and giddiness 03/15/2022   Dysequilibrium 03/15/2022   Vitamin D deficiency 03/15/2022   Stress incontinence 11/18/2021   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   Gastroesophageal reflux disease without esophagitis 11/18/2021   Allergic rhinitis due to animal hair and dander 11/18/2021    Allergies:  Allergies  Allergen Reactions   Sulfa Antibiotics Rash   Valacyclovir Other (See Comments)    Other reaction(s): Dizziness (intolerance)   Estradiol Itching    Vaginal pain and itching   Vraylar [Cariprazine] Rash   Medications:  Current Outpatient Medications:    doxycycline (VIBRA-TABS) 100 MG tablet, Take 1 tablet (100 mg total) by mouth 2 (two) times daily., Disp: 20 tablet, Rfl: 0   albuterol (VENTOLIN HFA) 108 (90 Base) MCG/ACT inhaler, Inhale 2 puffs into the lungs every 6 (six) hours as needed for wheezing or shortness of breath., Disp: 8 g, Rfl: 0   B Complex-C (B-COMPLEX WITH VITAMIN C) tablet, Take 1 tablet by mouth daily., Disp: , Rfl:    Cholecalciferol 50 MCG (2000 UT) TABS, Take by mouth., Disp: , Rfl:    estazolam (PROSOM) 2 MG tablet, Take 1 mg by mouth at bedtime., Disp: , Rfl:    FLUoxetine (PROZAC) 20 MG capsule, Take 30 mg by mouth daily., Disp: , Rfl:    lamoTRIgine (LAMICTAL) 100 MG tablet, Take 100 mg by mouth 2 (two) times daily., Disp: , Rfl:    levocetirizine (XYZAL) 5 MG tablet, Take 5 mg by mouth every evening., Disp: , Rfl:    lithium carbonate 150 MG capsule, Take 300 mg by mouth at bedtime., Disp: , Rfl:    LORazepam (ATIVAN) 0.5 MG tablet, Take 0.5 mg by mouth every 8 (eight) hours as needed for anxiety or sleep., Disp: , Rfl:    montelukast (SINGULAIR) 10 MG tablet, Take 1 tablet (10 mg total) by  mouth at bedtime., Disp: 30 tablet, Rfl: 3   omeprazole (PRILOSEC) 20 MG capsule, TAKE 1 CAPSULE BY MOUTH DAILY, Disp: 30 capsule, Rfl: 3  Observations/Objective: Patient is well-developed, well-nourished in no acute distress.  Resting comfortably at home.  Head is normocephalic, atraumatic.  No labored breathing.  Speech is clear and coherent with logical content.  Patient is alert and oriented at baseline.    Assessment and Plan: 1. Acute bacterial sinusitis - doxycycline (VIBRA-TABS) 100 MG tablet; Take 1 tablet (100 mg total) by mouth 2 (two) times daily.  Dispense: 20 tablet; Refill: 0  - Worsening symptoms that have not responded to OTC medications.  - Will give Doxycycline - Continue allergy medications.  - Steam and humidifier can help - Stay well hydrated and get plenty of rest.  - Seek in person evaluation if no symptom improvement or if symptoms worsen   Follow Up Instructions: I discussed the assessment and treatment plan with the patient. The patient was provided an opportunity to ask questions and all were answered. The patient agreed with the plan and demonstrated an understanding of the instructions.  A copy of instructions were sent to  the patient via MyChart unless otherwise noted below.    The patient was advised to call back or seek an in-person evaluation if the symptoms worsen or if the condition fails to improve as anticipated.    Margaretann Loveless, PA-C

## 2023-08-02 NOTE — Patient Instructions (Signed)
Vicki Newman, thank you for joining Margaretann Loveless, PA-C for today's virtual visit.  While this provider is not your primary care provider (PCP), if your PCP is located in our provider database this encounter information will be shared with them immediately following your visit.   A [facility] MyChart account gives you access to today's visit and all your visits, tests, and labs performed at [facility] " click here if you don't have a [facility] MyChart account or go to [email or web address]  Consent: (Patient) Vicki Newman provided verbal consent for this virtual visit at the beginning of the encounter.  Current Medications:  Current Outpatient Medications:    doxycycline (VIBRA-TABS) 100 MG tablet, Take 1 tablet (100 mg total) by mouth 2 (two) times daily., Disp: 20 tablet, Rfl: 0   albuterol (VENTOLIN HFA) 108 (90 Base) MCG/ACT inhaler, Inhale 2 puffs into the lungs every 6 (six) hours as needed for wheezing or shortness of breath., Disp: 8 g, Rfl: 0   B Complex-C (B-COMPLEX WITH VITAMIN C) tablet, Take 1 tablet by mouth daily., Disp: , Rfl:    Cholecalciferol 50 MCG (2000 UT) TABS, Take by mouth., Disp: , Rfl:    estazolam (PROSOM) 2 MG tablet, Take 1 mg by mouth at bedtime., Disp: , Rfl:    FLUoxetine (PROZAC) 20 MG capsule, Take 30 mg by mouth daily., Disp: , Rfl:    lamoTRIgine (LAMICTAL) 100 MG tablet, Take 100 mg by mouth 2 (two) times daily., Disp: , Rfl:    levocetirizine (XYZAL) 5 MG tablet, Take 5 mg by mouth every evening., Disp: , Rfl:    lithium carbonate 150 MG capsule, Take 300 mg by mouth at bedtime., Disp: , Rfl:    LORazepam (ATIVAN) 0.5 MG tablet, Take 0.5 mg by mouth every 8 (eight) hours as needed for anxiety or sleep., Disp: , Rfl:    montelukast (SINGULAIR) 10 MG tablet, Take 1 tablet (10 mg total) by mouth at bedtime., Disp: 30 tablet, Rfl: 3   omeprazole (PRILOSEC) 20 MG capsule, TAKE 1 CAPSULE BY MOUTH DAILY, Disp: 30 capsule, Rfl:  3   Medications ordered in this encounter:  Meds ordered this encounter  Medications   doxycycline (VIBRA-TABS) 100 MG tablet    Sig: Take 1 tablet (100 mg total) by mouth 2 (two) times daily.    Dispense:  20 tablet    Refill:  0    Order Specific Question:   Supervising Provider    Answer:   Merrilee Jansky [id number]     *If you need refills on other medications prior to your next appointment, please contact your pharmacy*  Follow-Up: Call back or seek an in-person evaluation if the symptoms worsen or if the condition fails to improve as anticipated.   Virtual Care [phone]  Other Instructions Sinus Infection, Adult A sinus infection, also called sinusitis, is inflammation of your sinuses. Sinuses are hollow spaces in the bones around your face. Your sinuses are located: Around your eyes. In the middle of your forehead. Behind your nose. In your cheekbones. Mucus normally drains out of your sinuses. When your nasal tissues become inflamed or swollen, mucus can become trapped or blocked. This allows bacteria, viruses, and fungi to grow, which leads to infection. Most infections of the sinuses are caused by a virus. A sinus infection can develop quickly. It can last for up to 4 weeks (acute) or for more than 12 weeks (chronic). A sinus infection often develops after a cold. What  are the causes? This condition is caused by anything that creates swelling in the sinuses or stops mucus from draining. This includes: Allergies. Asthma. Infection from bacteria or viruses. Deformities or blockages in your nose or sinuses. Abnormal growths in the nose (nasal polyps). Pollutants, such as chemicals or irritants in the air. Infection from fungi. This is rare. What increases the risk? You are more likely to develop this condition if you: Have a weak body defense system (immune system). Do a lot of swimming or diving. Overuse nasal sprays. Smoke. What are the signs or  symptoms? The main symptoms of this condition are pain and a feeling of pressure around the affected sinuses. Other symptoms include: Stuffy nose or congestion that makes it difficult to breathe through your nose. Thick yellow or greenish drainage from your nose. Tenderness, swelling, and warmth over the affected sinuses. A cough that may get worse at night. Decreased sense of smell and taste. Extra mucus that collects in the throat or the back of the nose (postnasal drip) causing a sore throat or bad breath. Tiredness (fatigue). Fever. How is this diagnosed? This condition is diagnosed based on: Your symptoms. Your medical history. A physical exam. Tests to find out if your condition is acute or chronic. This may include: Checking your nose for nasal polyps. Viewing your sinuses using a device that has a light (endoscope). Testing for allergies or bacteria. Imaging tests, such as an MRI or CT scan. In rare cases, a bone biopsy may be done to rule out more serious types of fungal sinus disease. How is this treated? Treatment for a sinus infection depends on the cause and whether your condition is chronic or acute. If caused by a virus, your symptoms should go away on their own within 10 days. You may be given medicines to relieve symptoms. They include: Medicines that shrink swollen nasal passages (decongestants). A spray that eases inflammation of the nostrils (topical intranasal corticosteroids). Rinses that help get rid of thick mucus in your nose (nasal saline washes). Medicines that treat allergies (antihistamines). Over-the-counter pain relievers. If caused by bacteria, your health care provider may recommend waiting to see if your symptoms improve. Most bacterial infections will get better without antibiotic medicine. You may be given antibiotics if you have: A severe infection. A weak immune system. If caused by narrow nasal passages or nasal polyps, surgery may be  needed. Follow these instructions at home: Medicines Take, use, or apply over-the-counter and prescription medicines only as told by your health care provider. These may include nasal sprays. If you were prescribed an antibiotic medicine, take it as told by your health care provider. Do not stop taking the antibiotic even if you start to feel better. Hydrate and humidify  Drink enough fluid to keep your urine pale yellow. Staying hydrated will help to thin your mucus. Use a cool mist humidifier to keep the humidity level in your home above 50%. Inhale steam for 10-15 minutes, 3-4 times a day, or as told by your health care provider. You can do this in the bathroom while a hot shower is running. Limit your exposure to cool or dry air. Rest Rest as much as possible. Sleep with your head raised (elevated). Make sure you get enough sleep each night. General instructions  Apply a warm, moist washcloth to your face 3-4 times a day or as told by your health care provider. This will help with discomfort. Use nasal saline washes as often as told by your health  care provider. Wash your hands often with soap and water to reduce your exposure to germs. If soap and water are not available, use hand sanitizer. Do not smoke. Avoid being around people who are smoking (secondhand smoke). Keep all follow-up visits. This is important. Contact a health care provider if: You have a fever. Your symptoms get worse. Your symptoms do not improve within 10 days. Get help right away if: You have a severe headache. You have persistent vomiting. You have severe pain or swelling around your face or eyes. You have vision problems. You develop confusion. Your neck is stiff. You have trouble breathing. These symptoms may be an emergency. Get help right away. Call 911. Do not wait to see if the symptoms will go away. Do not drive yourself to the hospital. Summary A sinus infection is soreness and inflammation of  your sinuses. Sinuses are hollow spaces in the bones around your face. This condition is caused by nasal tissues that become inflamed or swollen. The swelling traps or blocks the flow of mucus. This allows bacteria, viruses, and fungi to grow, which leads to infection. If you were prescribed an antibiotic medicine, take it as told by your health care provider. Do not stop taking the antibiotic even if you start to feel better. Keep all follow-up visits. This is important. This information is not intended to replace advice given to you by your health care provider. Make sure you discuss any questions you have with your health care provider. Document Revised: 09/22/2021 Document Reviewed: 09/22/2021 Elsevier Patient Education  2024 Elsevier Inc.    If you have been instructed to have an in-person evaluation today at a local Urgent Care facility, please use the link below. It will take you to a list of all of our available [facility], including address, phone number and hours of operation. Please do not delay care.  [facility]  If you or a family member do not have a primary care provider, use the link below to schedule a visit and establish care. When you choose a [facility] primary care physician or advanced practice provider, you gain a long-term partner in health. Find a Primary Care Provider  Learn more about [facility]'s in-office and virtual care options: [facility] - Get Care Now

## 2023-08-04 ENCOUNTER — Encounter: Payer: Self-pay | Admitting: Family

## 2023-08-04 NOTE — Telephone Encounter (Signed)
error 

## 2023-08-12 ENCOUNTER — Ambulatory Visit: Payer: 59 | Admitting: Family

## 2023-08-12 VITALS — BP 112/66 | HR 82 | Temp 98.3°F | Ht 65.0 in | Wt 136.2 lb

## 2023-08-12 DIAGNOSIS — J3489 Other specified disorders of nose and nasal sinuses: Secondary | ICD-10-CM

## 2023-08-12 DIAGNOSIS — J0110 Acute frontal sinusitis, unspecified: Secondary | ICD-10-CM | POA: Diagnosis not present

## 2023-08-12 DIAGNOSIS — J029 Acute pharyngitis, unspecified: Secondary | ICD-10-CM | POA: Diagnosis not present

## 2023-08-12 LAB — POCT RAPID STREP A (OFFICE): Rapid Strep A Screen: NEGATIVE

## 2023-08-12 MED ORDER — PREDNISONE 20 MG PO TABS
ORAL_TABLET | ORAL | 0 refills | Status: AC
Start: 2023-08-12 — End: ?

## 2023-08-12 NOTE — Progress Notes (Signed)
Established Patient Office Visit  Subjective:   Patient ID: Vicki Newman, female    DOB: 1960/11/18  Age: 62 y.o. MRN: [id number]  CC:  Chief Complaint  Patient presents with   Acute Visit    Still not feeling well. Reports PND, fever, sinus congestion. Symptoms started 2 weeks ago.    HPI: Vicki Newman is a 62 y.o. female presenting on 08/12/2023 for Acute Visit (Still not feeling well. Reports PND, fever, sinus congestion. Symptoms started 2 weeks ago.)  Seen virtually 10/1  Covid dx 07/15/23 had completed paxlovid, still never with complete symptom improvement. Prior to visit 9/13 with fever for one day prior. Was given RX doxyycline 100 mg po bid x 10 days.   Bil ear pain, nasal congestion, sinus pressure. No sore throat. No cough or chest congestion. Did not retest 10/1 for covid when she got a fever. She just completed doxycycline two days ago. Some slight improvement with cough.   Taking nasacort, xyzal and montelukast.      ROS: Negative unless specifically indicated above in HPI.   Relevant past medical history reviewed and updated as indicated.   Allergies and medications reviewed and updated.   Current Outpatient Medications:    predniSONE (DELTASONE) 20 MG tablet, Take two tablets daily for five days, Disp: 10 tablet, Rfl: 0   albuterol (VENTOLIN HFA) 108 (90 Base) MCG/ACT inhaler, Inhale 2 puffs into the lungs every 6 (six) hours as needed for wheezing or shortness of breath., Disp: 8 g, Rfl: 0   B Complex-C (B-COMPLEX WITH VITAMIN C) tablet, Take 1 tablet by mouth daily., Disp: , Rfl:    Cholecalciferol 50 MCG (2000 UT) TABS, Take by mouth., Disp: , Rfl:    estazolam (PROSOM) 2 MG tablet, Take 1 mg by mouth at bedtime., Disp: , Rfl:    FLUoxetine (PROZAC) 20 MG capsule, Take 30 mg by mouth daily., Disp: , Rfl:    lamoTRIgine (LAMICTAL) 100 MG tablet, Take 100 mg by mouth 2 (two) times daily., Disp: , Rfl:    levocetirizine (XYZAL) 5 MG tablet, Take 5  mg by mouth every evening., Disp: , Rfl:    lithium carbonate 150 MG capsule, Take 300 mg by mouth at bedtime., Disp: , Rfl:    LORazepam (ATIVAN) 0.5 MG tablet, Take 0.5 mg by mouth every 8 (eight) hours as needed for anxiety or sleep., Disp: , Rfl:    montelukast (SINGULAIR) 10 MG tablet, Take 1 tablet (10 mg total) by mouth at bedtime., Disp: 30 tablet, Rfl: 3   omeprazole (PRILOSEC) 20 MG capsule, TAKE 1 CAPSULE BY MOUTH DAILY, Disp: 30 capsule, Rfl: 3  Allergies  Allergen Reactions   Sulfa Antibiotics Rash   Valacyclovir Other (See Comments)    Other reaction(s): Dizziness (intolerance)   Estradiol Itching    Vaginal pain and itching   Vraylar [Cariprazine] Rash    Objective:   BP 112/66   Pulse 82   Temp 98.3 F (36.8 C) (Oral)   Ht 5\' 5"  (1.651 m)   Wt 136 lb 3.2 oz (61.8 kg)   SpO2 98%   BMI 22.66 kg/m    Physical Exam Vitals reviewed.  Constitutional:      General: She is not in acute distress.    Appearance: Normal appearance. She is normal weight. She is not ill-appearing, toxic-appearing or diaphoretic.  HENT:     Head: Normocephalic.     Right Ear: Tympanic membrane normal.     Left Ear: Tympanic membrane  normal.     Nose: Nose normal.     Mouth/Throat:     Mouth: Mucous membranes are dry.     Pharynx: No oropharyngeal exudate or posterior oropharyngeal erythema.  Eyes:     Extraocular Movements: Extraocular movements intact.     Pupils: Pupils are equal, round, and reactive to light.  Cardiovascular:     Rate and Rhythm: Normal rate and regular rhythm.     Pulses: Normal pulses.     Heart sounds: Normal heart sounds.  Pulmonary:     Effort: Pulmonary effort is normal.     Breath sounds: Normal breath sounds.  Musculoskeletal:     Cervical back: Normal range of motion.  Lymphadenopathy:     Cervical: Cervical adenopathy present.     Right cervical: Superficial cervical adenopathy present.     Left cervical: Superficial cervical adenopathy present.   Neurological:     General: No focal deficit present.     Mental Status: She is alert and oriented to person, place, and time. Mental status is at baseline.  Psychiatric:        Mood and Affect: Mood normal.        Behavior: Behavior normal.        Thought Content: Thought content normal.        Judgment: Judgment normal.     Assessment & Plan:  Sinus pressure -     predniSONE; Take two tablets daily for five days  Dispense: 10 tablet; Refill: 0  Sore throat -     POCT rapid strep A  Acute non-recurrent frontal sinusitis Assessment & Plan: Strep negative in office.  Rx prednisone 40 mg x 5 days.  Mucinex recommended as well Cont with allergy medications.       Follow up plan: Return if symptoms worsen or fail to improve.  Mort Sawyers, FNP

## 2023-08-12 NOTE — Assessment & Plan Note (Signed)
Strep negative in office.  Rx prednisone 40 mg x 5 days.  Mucinex recommended as well Cont with allergy medications.

## 2023-08-23 ENCOUNTER — Encounter: Payer: Self-pay | Admitting: Family

## 2023-08-23 ENCOUNTER — Encounter: Payer: Self-pay | Admitting: Psychiatry

## 2023-09-12 ENCOUNTER — Other Ambulatory Visit: Payer: Self-pay | Admitting: Family

## 2023-09-12 DIAGNOSIS — K219 Gastro-esophageal reflux disease without esophagitis: Secondary | ICD-10-CM

## 2023-09-16 ENCOUNTER — Other Ambulatory Visit: Payer: Self-pay | Admitting: Family

## 2023-09-16 DIAGNOSIS — J3489 Other specified disorders of nose and nasal sinuses: Secondary | ICD-10-CM

## 2023-10-06 IMAGING — CT CT HEAD W/O CM
4 series · 17 of 47 positions shown, 19 images · non-contrast
Comparison: None.

CLINICAL DATA: Head trauma, focal neurological findings. Headache,
new or worsening. Fell 2 weeks ago. Right temporal pain.



[Series 2: head wo · axial · 0.45mm/px · z∈[+1180,+1300]mm · 7 of 34 slices shown, 9 images]
[im 5/34  brain]
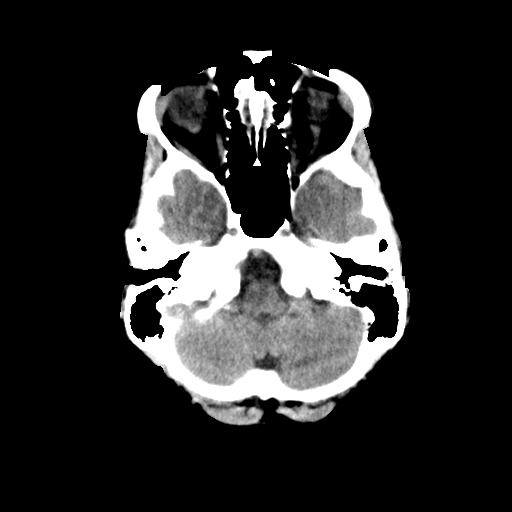
[im 5/34  bone]
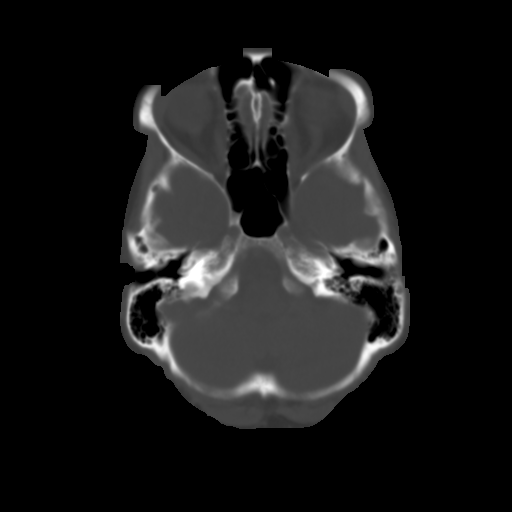
[im 9/34  brain]
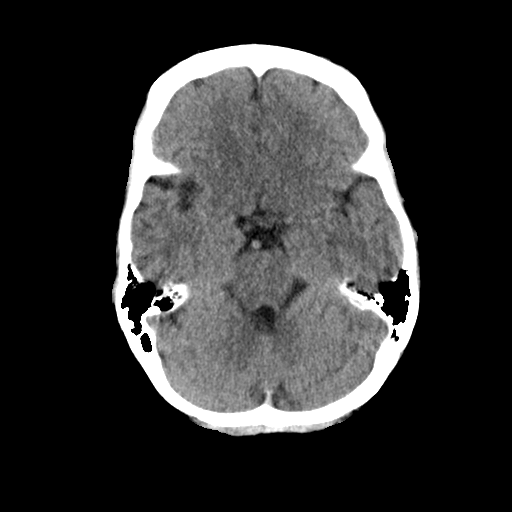
[im 13/34  brain]
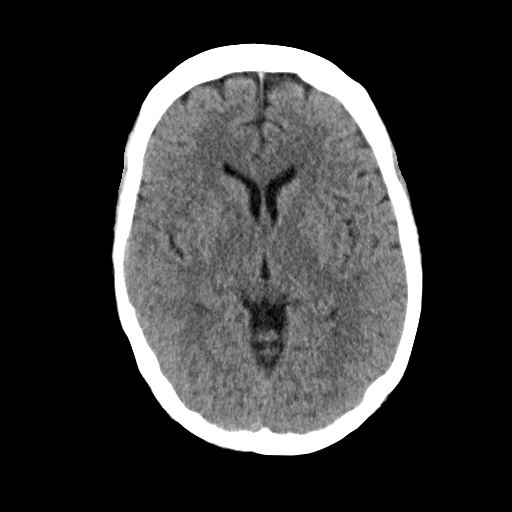
[im 17/34  brain]
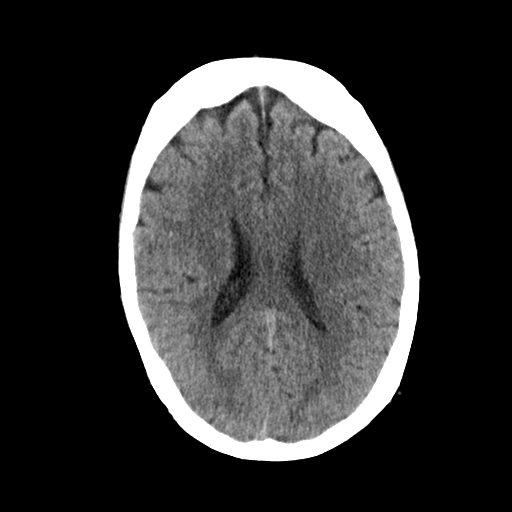
[im 21/34  brain]
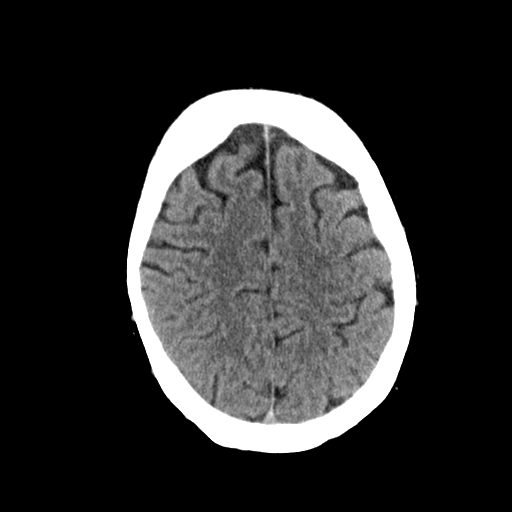
[im 21/34  bone]
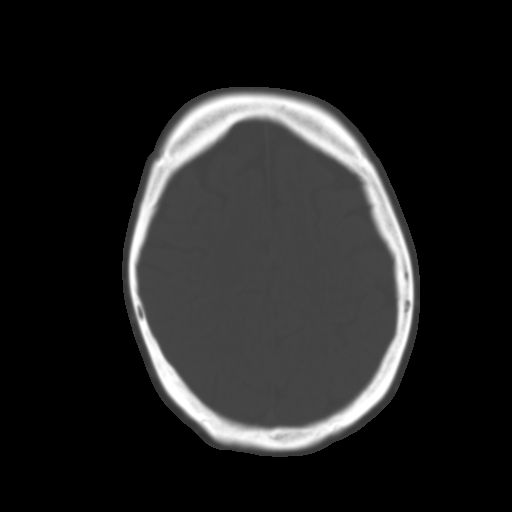
[im 25/34  brain]
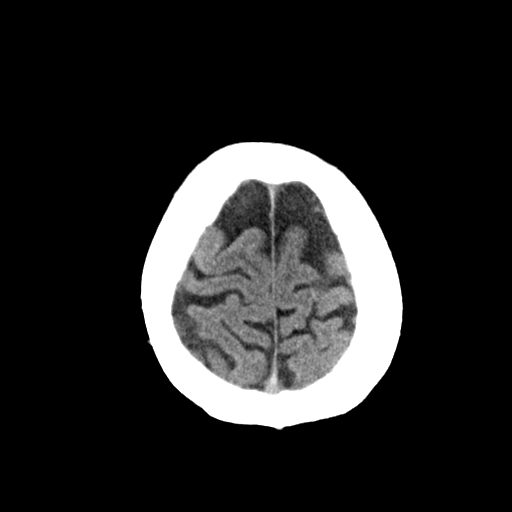
[im 29/34  brain]
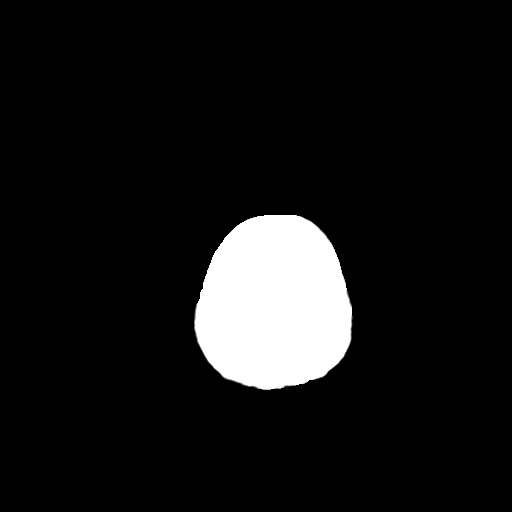

[Series 3: head bone · axial · 0.45mm/px · z∈[+1176,+1234]mm · 4 of 84 slices shown]
[im 9/84  bone]
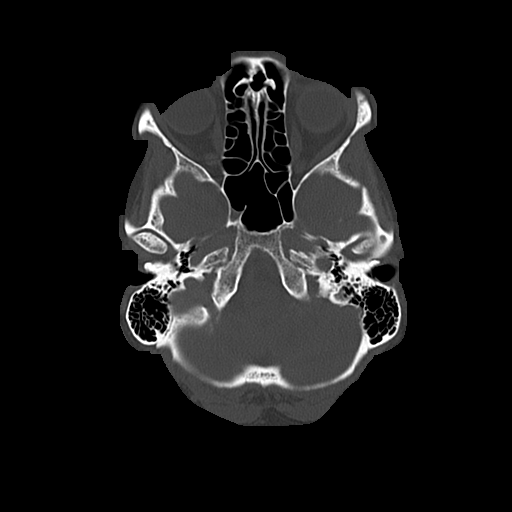
[im 17/84  bone]
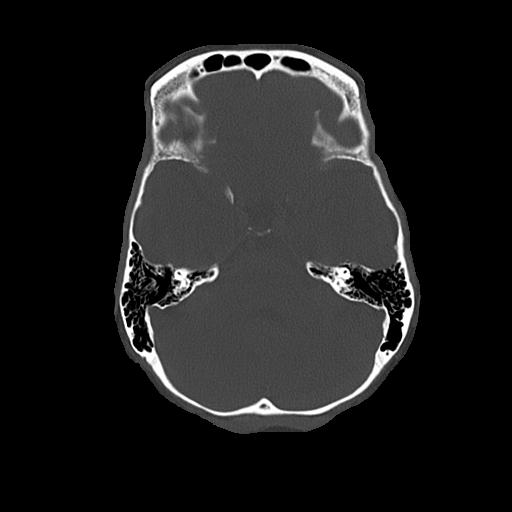
[im 25/84  bone]
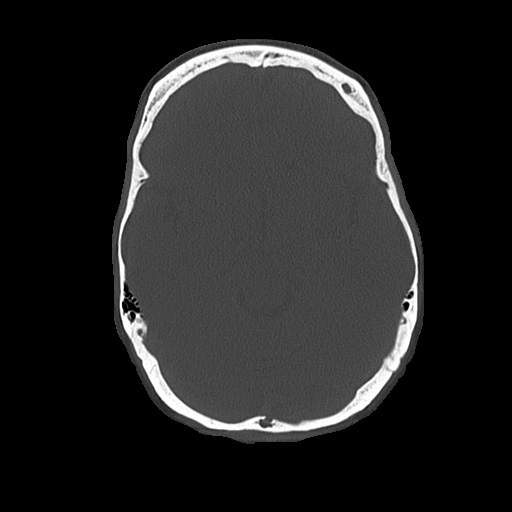
[im 38/84  bone]
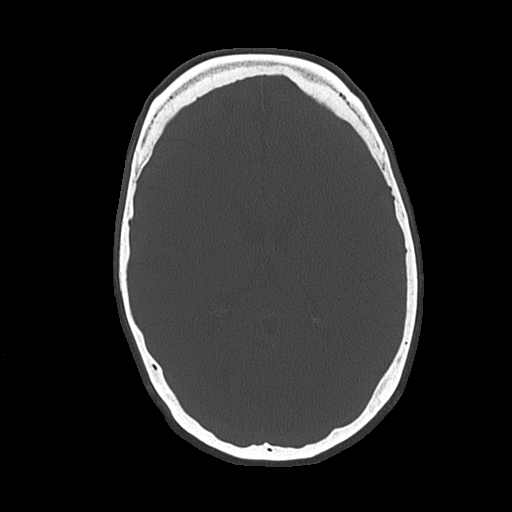

[Series 4: coronal soft · coronal · 0.33mm/px · 3 of 64 slices shown]
[im 22/64  brain]
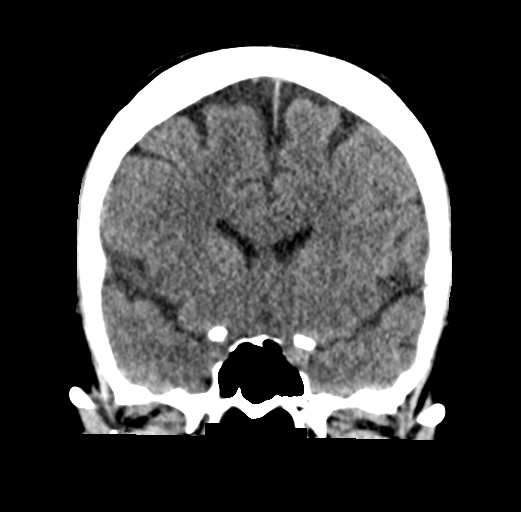
[im 29/64  brain]
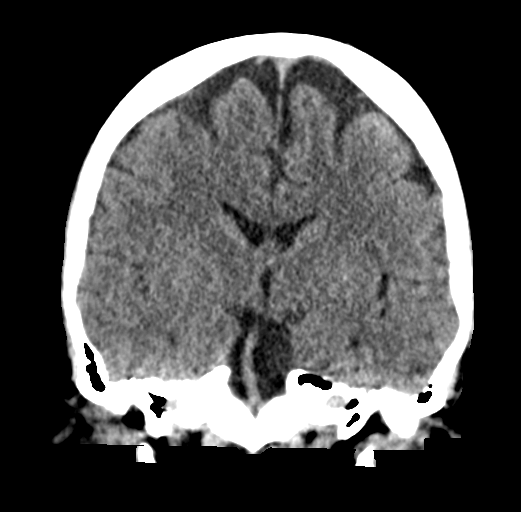
[im 36/64  brain]
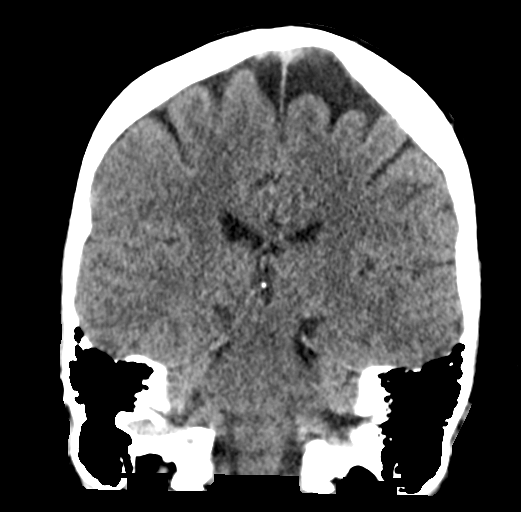

[Series 5: sagittal soft · sagittal · 0.33mm/px · 3 of 52 slices shown]
[im 18/52  brain]
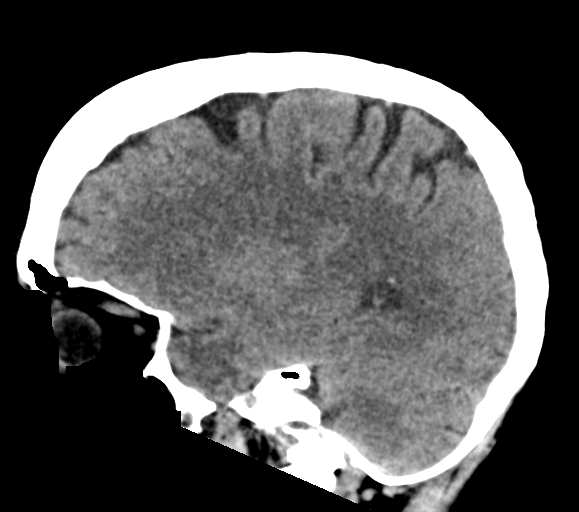
[im 26/52  brain]
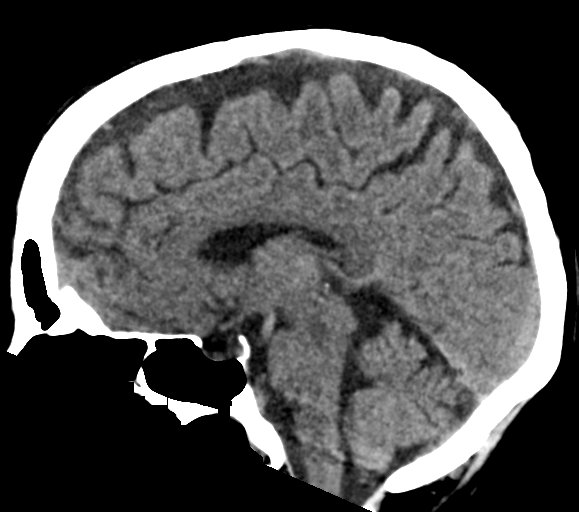
[im 35/52  brain]
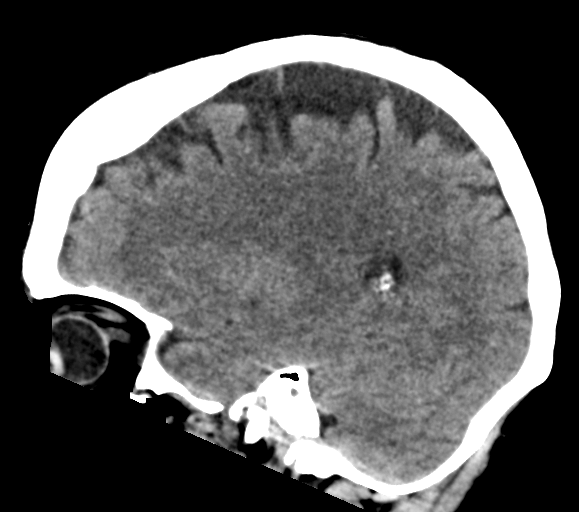

[17 of 47 positions shown; findings below may reference images not displayed]

FINDINGS: Brain: The brain shows a normal appearance without evidence of
malformation, atrophy, old or acute small or large vessel
infarction, mass lesion, hemorrhage, hydrocephalus or extra-axial
collection.

Vascular: No hyperdense vessel. No evidence of atherosclerotic
calcification.

Skull: Normal.  No traumatic finding.  No focal bone lesion.

Sinuses/Orbits: Sinuses are clear. Orbits appear normal. Mastoids
are clear.

Other: Skin marker put at the location of pain does not correlate
with any pathologic finding.
IMPRESSION: Normal head CT. Skin marker put at the location of pain does not
correlate with any pathologic finding.

## 2023-10-22 ENCOUNTER — Other Ambulatory Visit: Payer: Self-pay | Admitting: Family

## 2023-10-22 DIAGNOSIS — J301 Allergic rhinitis due to pollen: Secondary | ICD-10-CM

## 2023-11-03 ENCOUNTER — Other Ambulatory Visit: Payer: Self-pay | Admitting: Family

## 2023-11-03 DIAGNOSIS — J301 Allergic rhinitis due to pollen: Secondary | ICD-10-CM

## 2023-11-18 ENCOUNTER — Telehealth: Payer: Self-pay | Admitting: Family

## 2023-11-18 NOTE — Telephone Encounter (Signed)
Copied from CRM [id number]. Topic: Appointments - Transfer of Care >> Nov 18, 2023  3:40 PM Chantha C wrote: Pt is requesting to transfer FROM: NP, Tabitha Dugal  Pt is requesting to transfer TO: NP, Moshe Cipro Reason for requested transfer: NP, Moshe Cipro is closer to patient's home. Please call patient back at [phone] It is the responsibility of the team the patient would like to transfer to (NP, Mort Sawyers) to reach out to the patient if for any reason this transfer is not acceptable.

## 2023-11-21 NOTE — Telephone Encounter (Signed)
Lvm to let pt know tabitha is ok with the TOC

## 2023-11-21 NOTE — Telephone Encounter (Signed)
lvm for pt to call office schedule a toc appt

## 2023-11-21 NOTE — Telephone Encounter (Signed)
I am ok with TOC 

## 2024-02-28 ENCOUNTER — Other Ambulatory Visit: Payer: Self-pay | Admitting: Family

## 2024-02-28 DIAGNOSIS — J301 Allergic rhinitis due to pollen: Secondary | ICD-10-CM

## 2024-03-08 ENCOUNTER — Other Ambulatory Visit: Payer: Self-pay | Admitting: Family

## 2024-03-08 DIAGNOSIS — K219 Gastro-esophageal reflux disease without esophagitis: Secondary | ICD-10-CM

## 2024-03-26 ENCOUNTER — Encounter: Admitting: Family Medicine

## 2024-07-09 ENCOUNTER — Encounter: Payer: Self-pay | Admitting: Family Medicine

## 2024-07-29 ENCOUNTER — Other Ambulatory Visit: Payer: Self-pay | Admitting: Family

## 2024-07-29 DIAGNOSIS — J301 Allergic rhinitis due to pollen: Secondary | ICD-10-CM

## 2024-07-30 NOTE — Telephone Encounter (Signed)
 Can we check with pt, I believe she established with a new family practice, Dr. Lelon? IF so can we take me off as her primary

## 2024-08-04 ENCOUNTER — Other Ambulatory Visit: Payer: Self-pay | Admitting: Family

## 2024-08-04 DIAGNOSIS — J301 Allergic rhinitis due to pollen: Secondary | ICD-10-CM

## 2024-08-16 ENCOUNTER — Other Ambulatory Visit (HOSPITAL_BASED_OUTPATIENT_CLINIC_OR_DEPARTMENT_OTHER): Payer: Self-pay | Admitting: Internal Medicine

## 2024-08-16 DIAGNOSIS — Z1231 Encounter for screening mammogram for malignant neoplasm of breast: Secondary | ICD-10-CM

## 2024-08-18 ENCOUNTER — Ambulatory Visit (HOSPITAL_BASED_OUTPATIENT_CLINIC_OR_DEPARTMENT_OTHER)
Admission: RE | Admit: 2024-08-18 | Discharge: 2024-08-18 | Disposition: A | Discharge status: Home / Self Care | Payer: Self-pay | Source: Ambulatory Visit | Attending: Internal Medicine | Admitting: Internal Medicine

## 2024-08-18 DIAGNOSIS — Z1231 Encounter for screening mammogram for malignant neoplasm of breast: Secondary | ICD-10-CM | POA: Insufficient documentation

## 2024-08-28 ENCOUNTER — Other Ambulatory Visit: Payer: Self-pay | Admitting: Family

## 2024-08-28 DIAGNOSIS — J301 Allergic rhinitis due to pollen: Secondary | ICD-10-CM

## 2024-09-03 ENCOUNTER — Other Ambulatory Visit: Payer: Self-pay | Admitting: Family

## 2024-09-03 DIAGNOSIS — K219 Gastro-esophageal reflux disease without esophagitis: Secondary | ICD-10-CM

## 2024-09-23 ENCOUNTER — Other Ambulatory Visit: Payer: Self-pay | Admitting: Family

## 2024-09-23 DIAGNOSIS — J301 Allergic rhinitis due to pollen: Secondary | ICD-10-CM
